# Patient Record
Sex: Female | Born: 1940 | Race: White | Hispanic: No | Marital: Married | Smoking: Never smoker
Health system: Southern US, Community
[De-identification: ages and names within clinical notes are randomized; demographics above are authoritative.]

## PROBLEM LIST (undated history)

## (undated) DIAGNOSIS — L409 Psoriasis, unspecified: Secondary | ICD-10-CM

## (undated) DIAGNOSIS — Z8719 Personal history of other diseases of the digestive system: Secondary | ICD-10-CM

## (undated) DIAGNOSIS — Z8619 Personal history of other infectious and parasitic diseases: Secondary | ICD-10-CM

## (undated) DIAGNOSIS — C50919 Malignant neoplasm of unspecified site of unspecified female breast: Secondary | ICD-10-CM

## (undated) DIAGNOSIS — M810 Age-related osteoporosis without current pathological fracture: Secondary | ICD-10-CM

## (undated) DIAGNOSIS — M1990 Unspecified osteoarthritis, unspecified site: Secondary | ICD-10-CM

## (undated) DIAGNOSIS — Z923 Personal history of irradiation: Secondary | ICD-10-CM

## (undated) HISTORY — PX: TONSILLECTOMY: SUR1361

## (undated) HISTORY — PX: COLONOSCOPY: SHX174

## (undated) HISTORY — DX: Psoriasis, unspecified: L40.9

## (undated) HISTORY — PX: FRACTURE SURGERY: SHX138

## (undated) HISTORY — DX: Age-related osteoporosis without current pathological fracture: M81.0

## (undated) HISTORY — PX: MASTECTOMY: SHX3

## (undated) HISTORY — DX: Unspecified osteoarthritis, unspecified site: M19.90

## (undated) HISTORY — DX: Malignant neoplasm of unspecified site of unspecified female breast: C50.919

---

## 2004-08-05 ENCOUNTER — Ambulatory Visit: Payer: Self-pay | Admitting: Internal Medicine

## 2006-03-03 ENCOUNTER — Ambulatory Visit: Payer: Self-pay | Admitting: Internal Medicine

## 2007-01-07 ENCOUNTER — Ambulatory Visit: Payer: Self-pay | Admitting: Gastroenterology

## 2007-03-08 ENCOUNTER — Ambulatory Visit: Payer: Self-pay | Admitting: Internal Medicine

## 2008-03-08 ENCOUNTER — Ambulatory Visit: Payer: Self-pay | Admitting: Internal Medicine

## 2009-03-09 ENCOUNTER — Ambulatory Visit: Payer: Self-pay | Admitting: Internal Medicine

## 2010-05-28 ENCOUNTER — Ambulatory Visit: Payer: Self-pay | Admitting: Internal Medicine

## 2011-06-05 ENCOUNTER — Emergency Department: Payer: Self-pay | Admitting: Emergency Medicine

## 2011-06-10 ENCOUNTER — Ambulatory Visit: Payer: Self-pay | Admitting: Orthopedic Surgery

## 2011-07-29 ENCOUNTER — Ambulatory Visit: Payer: Self-pay | Admitting: Internal Medicine

## 2012-05-31 ENCOUNTER — Ambulatory Visit: Payer: Self-pay | Admitting: Gastroenterology

## 2012-08-13 ENCOUNTER — Ambulatory Visit: Payer: Self-pay

## 2013-09-23 ENCOUNTER — Ambulatory Visit: Payer: Self-pay

## 2014-08-18 DIAGNOSIS — Z923 Personal history of irradiation: Secondary | ICD-10-CM

## 2014-08-18 HISTORY — PX: BREAST LUMPECTOMY: SHX2

## 2014-08-18 HISTORY — DX: Personal history of irradiation: Z92.3

## 2014-09-18 DIAGNOSIS — C50919 Malignant neoplasm of unspecified site of unspecified female breast: Secondary | ICD-10-CM

## 2014-09-18 HISTORY — DX: Malignant neoplasm of unspecified site of unspecified female breast: C50.919

## 2014-09-25 ENCOUNTER — Ambulatory Visit: Payer: Self-pay | Admitting: Internal Medicine

## 2014-09-26 ENCOUNTER — Ambulatory Visit: Payer: Self-pay | Admitting: Internal Medicine

## 2014-09-28 ENCOUNTER — Ambulatory Visit: Payer: Self-pay | Admitting: Internal Medicine

## 2014-09-28 HISTORY — PX: BREAST BIOPSY: SHX20

## 2014-10-10 ENCOUNTER — Ambulatory Visit: Payer: Self-pay | Admitting: Internal Medicine

## 2014-10-17 ENCOUNTER — Ambulatory Visit
Admit: 2014-10-17 | Disposition: A | Discharge status: Home / Self Care | Payer: Self-pay | Attending: Internal Medicine | Admitting: Internal Medicine

## 2014-11-09 ENCOUNTER — Ambulatory Visit: Payer: Self-pay | Admitting: Surgery

## 2014-11-09 LAB — CBC WITH DIFFERENTIAL/PLATELET
Basophil #: 0 10*3/uL (ref 0.0–0.1)
Basophil %: 0.6 %
Eosinophil #: 0.2 10*3/uL (ref 0.0–0.7)
Eosinophil %: 4.9 %
HCT: 39.5 % (ref 35.0–47.0)
HGB: 12.9 g/dL (ref 12.0–16.0)
LYMPHS ABS: 1.2 10*3/uL (ref 1.0–3.6)
Lymphocyte %: 23.9 %
MCH: 30.6 pg (ref 26.0–34.0)
MCHC: 32.7 g/dL (ref 32.0–36.0)
MCV: 94 fL (ref 80–100)
MONO ABS: 0.6 x10 3/mm (ref 0.2–0.9)
Monocyte %: 12.7 %
Neutrophil #: 2.8 10*3/uL (ref 1.4–6.5)
Neutrophil %: 57.9 %
PLATELETS: 139 10*3/uL — AB (ref 150–440)
RBC: 4.21 10*6/uL (ref 3.80–5.20)
RDW: 15.6 % — AB (ref 11.5–14.5)
WBC: 4.9 10*3/uL (ref 3.6–11.0)

## 2014-11-09 LAB — BASIC METABOLIC PANEL
ANION GAP: 7 (ref 7–16)
BUN: 15 mg/dL
Calcium, Total: 9.9 mg/dL
Chloride: 105 mmol/L
Co2: 27 mmol/L
Creatinine: 0.85 mg/dL
EGFR (African American): >60
GLUCOSE: 110 mg/dL — AB
Potassium: 4.7 mmol/L
Sodium: 139 mmol/L

## 2014-11-17 ENCOUNTER — Ambulatory Visit: Admit: 2014-11-17 | Disposition: A | Discharge status: Home / Self Care | Payer: Self-pay | Admitting: Surgery

## 2014-11-22 ENCOUNTER — Ambulatory Visit
Admit: 2014-11-22 | Disposition: A | Discharge status: Home / Self Care | Payer: Self-pay | Attending: Internal Medicine | Admitting: Internal Medicine

## 2014-12-07 ENCOUNTER — Ambulatory Visit: Payer: Commercial Managed Care - HMO

## 2014-12-08 ENCOUNTER — Ambulatory Visit: Payer: Commercial Managed Care - HMO

## 2014-12-11 ENCOUNTER — Ambulatory Visit: Payer: Commercial Managed Care - HMO

## 2014-12-11 LAB — SURGICAL PATHOLOGY

## 2014-12-12 ENCOUNTER — Ambulatory Visit: Payer: Commercial Managed Care - HMO

## 2014-12-13 ENCOUNTER — Ambulatory Visit: Payer: Commercial Managed Care - HMO

## 2014-12-14 ENCOUNTER — Other Ambulatory Visit: Payer: Self-pay | Admitting: *Deleted

## 2014-12-14 ENCOUNTER — Ambulatory Visit: Payer: Commercial Managed Care - HMO

## 2014-12-14 DIAGNOSIS — C50512 Malignant neoplasm of lower-outer quadrant of left female breast: Secondary | ICD-10-CM

## 2014-12-15 ENCOUNTER — Ambulatory Visit: Payer: Commercial Managed Care - HMO

## 2014-12-15 LAB — CBC CANCER CENTER
Basophil #: 0 x10 3/mm (ref 0.0–0.1)
Basophil %: 0.7 %
EOS PCT: 5.7 %
Eosinophil #: 0.2 x10 3/mm (ref 0.0–0.7)
HCT: 37.9 % (ref 35.0–47.0)
HGB: 12.6 g/dL (ref 12.0–16.0)
Lymphocyte #: 1 x10 3/mm (ref 1.0–3.6)
Lymphocyte %: 25.6 %
MCH: 30.8 pg (ref 26.0–34.0)
MCHC: 33.2 g/dL (ref 32.0–36.0)
MCV: 93 fL (ref 80–100)
MONO ABS: 0.6 x10 3/mm (ref 0.2–0.9)
Monocyte %: 14.6 %
NEUTROS ABS: 2.1 x10 3/mm (ref 1.4–6.5)
Neutrophil %: 53.4 %
PLATELETS: 131 x10 3/mm — AB (ref 150–440)
RBC: 4.09 10*6/uL (ref 3.80–5.20)
RDW: 15.3 % — ABNORMAL HIGH (ref 11.5–14.5)
WBC: 3.8 x10 3/mm (ref 3.6–11.0)

## 2014-12-17 NOTE — Consult Note (Signed)
Reason for Visit: This 74 year old Female patient presents to the clinic for initial evaluation of  breast cancer .   Referred by Dr. Tamala Julian.  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  Pathology Report pathology report given verbally have requested written copy   Imaging Report mammograms ultrasound reviewed   Referral Report clinical notes reviewed   Planned Treatment Regimen adjuvant whole breast radiation hypofractionated course plus aromatase inhibitor   HPI   patient is a pleasant 74 year old female who presented with an abnormal mammogram of her left breast showing a mass in the retroareolar region confirmed on ultrasound to be 0.7 x 0.4 cm 1 cm from nipple suspicious for malignancy. Ultrasound-guided core biopsy was positive for invasive lobular carcinoma. Tumor was ER/PR positive HER-2/neu not overexpressed.she went on to have a wide local excision and sentinel node biopsy. 6 nodes were negative. Tumor was 0.6 cm in greatest dimension margins clear. I requested formal pathology report for my review. Patient has comorbidities including psoriasis, psoriatic arthritis, diverticulitis, osteoarthritis and osteoporosis for which she is being treated with Fosamax. She is seen today by medical oncology as well as radiation oncology. She is healing well and from a breast standpoint is without complaint.  Past Hx:    Psoratic Arthritis:    ARTHRITIS:   Past, Family and Social History:  Past Medical History positive   Gastrointestinal diverticulitis; colon polyp   Past Surgical History tonsillectomy, adenoidectomy, left wrist fracture repair and right toe procedure   Past Medical History Comments psoriatic arthritis, psoriasis, osteoporosis   Family History positive   Family History Comments  family history positive for adult-onset diabetes, CVA hypertension and hyperlipidemia, father with lung cancer brother had a brain tumor   Social History noncontributory   Additional Past Medical and Surgical History seen by herself today   Allergies:   No Known Allergies:   Home Meds:  Home Medications: Medication Instructions Status  methotrexate 2.5 mg oral tablet 9 tab(s) orally once a week (on Friday) Active  alendronate 70 mg oral tablet 1 tab(s) orally once a week (on Friday) Active  folic acid 0.4 mg oral tablet 1 tab(s) orally once a day Active  Norco 325 mg-5 mg oral tablet 1-2 tab(s) orally every 4 hours, As Needed - for Pain Active   Review of Systems:  General negative   Performance Status (ECOG) 0   Skin negative   Breast see HPI   Ophthalmologic negative   ENMT negative   Respiratory and Thorax negative   Cardiovascular negative   Gastrointestinal negative   Genitourinary negative   Musculoskeletal negative   Neurological negative   Psychiatric negative   Hematology/Lymphatics negative   Endocrine negative   Allergic/Immunologic negative   Review of Systems   denies any weight loss, fatigue, weakness, fever, chills or night sweats. Patient denies any loss of vision, blurred vision. Patient denies any ringing  of the ears or hearing loss. No irregular heartbeat. Patient denies heart murmur or history of fainting. Patient denies any chest pain or pain radiating to her upper extremities. Patient denies any shortness of breath, difficulty breathing at night, cough or hemoptysis. Patient denies any swelling in the lower legs. Patient denies any nausea vomiting, vomiting of blood, or coffee ground material in the vomitus. Patient denies any stomach pain. Patient states has had normal  bowel movements no significant constipation or diarrhea. Patient denies any dysuria, hematuria or significant nocturia. Patient denies any problems walking, swelling in the  joints or loss of balance. Patient denies any skin changes, loss of hair or loss of weight. Patient denies any excessive worrying or anxiety or significant depression. Patient denies any problems with insomnia. Patient denies excessive thirst, polyuria, polydipsia. Patient denies any swollen glands, patient denies easy bruising or easy bleeding. Patient denies any recent infections, allergies or URI. Patient "s visual fields have not changed significantly in recent time.   Nursing Notes:  Nursing Vital Signs and Chemo Nursing Nursing Notes: *CC Vital Signs Flowsheet:   06-Apr-16 09:34  Temp Temperature 95.2  Pulse Pulse 91  Respirations Respirations 18  SBP SBP 153  DBP DBP 78  Pain Scale (0-10)  0  Current Weight (kg) (kg) 61.2  Height (cm) centimeters 154.9  BSA (m2) 1.5   Physical Exam:  General/Skin/HEENT:  General normal   Skin normal   Eyes normal   ENMT normal   Head and Neck normal   Additional PE well-developed well-nourished female in NAD. She is status post wide local excision circumventing the left nipple areolar complex. Incision is well-healed no dominant mass or nodularity is noted in either breast in 2 positions examined. No axillary or supraclavicular adenopathy is appreciated bilaterally. Lungs are clear to A&P cardiac examination shows regular rate and rhythm.   Breasts/Resp/CV/GI/GU:  Respiratory and Thorax normal   Cardiovascular normal   Gastrointestinal normal   Genitourinary normal   MS/Neuro/Psych/Lymph:  Musculoskeletal normal   Neurological normal   Lymphatics normal   Other Results:  Radiology Results: [place]:    09-Feb-16 14:41, Digital Additional Views Lt Breast (SCR)  Digital Additional Views Lt Breast (SCR)   REASON FOR EXAM:    AV LT MASS  COMMENTS:       PROCEDURE: MAM - MAM DGTL ADD VW LT  SCR  - Sep 26 2014  2:41PM     CLINICAL DATA:  The patient returns after screening study for  evaluation of possible left  breast mass.    EXAM:  DIGITAL DIAGNOSTIC LEFT MAMMOGRAM    ULTRASOUND LEFT BREAST    COMPARISON:  09/25/2014 and earlier  ACR Breast Density Category c: The breast tissue is heterogeneously  dense, which may obscure small masses.    FINDINGS:  Additional views are performed, confirming presence of possible mass  in the immediate retroareolar region of the left breast.    On physical exam, I palpate no abnormality in the retroareolar  region of the left breast. Normal position and appearance of the  left nipple.    Targeted ultrasound is performed, showing irregular hypoechoic  nodule in the 6 o'clock location of the left breast 1 cm from the  nipple measuring 0.7 x 0.4 x 0.4 cm. No associated internal  vascularity. Evaluation of the left axilla is negative for  adenopathy.     IMPRESSION:  Suspicious mass in the 6 o'clock retroareolar region of the left  breast. Tissue diagnosis is recommended.    RECOMMENDATION:  Ultrasound-guided core biopsy is recommended.    I have discussed the findings and recommendations with the patient.  Results were also provided in writing at the conclusion of the  visit. If applicable, a reminder letter will be sent to the patient  regarding the next appointment.    BI-RADS CATEGORY  4: Suspicious.  Electronically Signed    By: Nolon Nations M.D.  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 Would also boost or scar another 1400 cGy  using electron beam. Patient is on methotrexate which make up her incidence of pulmonary complications we will do our best to exclude as much lung as possible from her treatment fields. Risks and benefits of treatment including skin reaction, fatigue, inclusion of some superficial lung, alteration of blood counts, all were described in detail to the patient. She seems to comprehend my treatment plan well. I have set up and ordered CT simulation on her early next week.  I would like to take this opportunity for allowing me to participate in the care of your patient..  Fax to Physician:  Physicians To Recieve Fax: Glendon Axe, MD - [phone] Rochel Brome, MD - [phone].  Electronic Signatures: Cachet Mccutchen, Roda Shutters (MD)  (Signed 06-Apr-16 10:58)  Authored: HPI, Diagnosis, Past Hx, PFSH, Allergies, Home Meds, ROS, Nursing Notes, Physical Exam, Other Results, Relevent Results, Encounter Assessment and Plan, Fax to Physician   Last Updated: 06-Apr-16 10:58 by Armstead Peaks (MD)

## 2014-12-17 NOTE — Op Note (Signed)
PATIENT NAME:  April Mills, April Mills MR#:  [id number] DATE OF BIRTH:  1941-04-05  DATE OF PROCEDURE:  11/17/2014  PREOPERATIVE DIAGNOSIS: Carcinoma of the left breast.   POSTOPERATIVE DIAGNOSIS: Carcinoma of the left breast.   PROCEDURE: Left partial mastectomy with axillary sentinel lymph node biopsy.   SURGEON: Rochel Brome, MD  ANESTHESIA: General.   INDICATIONS: This 74 year old female has a recent finding of a 7 mm mass at the 6 o'clock position of the left breast. 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 She had preoperative x-ray needle localization and injection of radioactive technetium sulfur colloid.   DESCRIPTION OF PROCEDURE: The patient was placed on the operating table in the supine position under general anesthesia. The left arm was placed on a lateral arm support. The dressing was removed from the inferior aspect of the left breast exposing the Kopans wire which entered the 6 o'clock position of the breast. The wire was cut 2 cm from the skin. The breast, axilla, and surrounding chest wall were prepared with ChloraPrep and draped in a sterile manner.   Her mammogram images were reviewed seeing the location of the biopsy marker, in the 6 o'clock position.  A transversely oriented curvilinear incision was made about 1 cm below the nipple and extending from approximately the 5 o'clock position to the 7 o'clock position. Incision was carried down through subcutaneous tissues and encountered the wire and removed a portion of tissue surrounding the wire which was approximately 3 x 3 x 3 cm in dimension, and it was labeled with the margin map to mark the skin side, the deep margin, also the medial, lateral, cranial and caudal margins. The specimen was submitted for specimen mammogram and pathology.   The axilla was probed with a gamma counter demonstrating location of radioactivity in the inferior aspect of the axilla. An oblique 4 cm incision was made in  the inferior aspect of the axilla and carried down through subcutaneous tissues. Several small bleeding points were cauterized. The gamma counter was used to demonstrate location of radioactivity and dissected a lymph node with some surrounding fatty tissue free from surrounding structures. The ex vivo count was in the range of 200 to 250 counts per second and this was sent as sentinel lymph node #1. There was additional radioactivity found and dissected out another portion of tissue somewhat deeper in which the ex vivo count was negligible and this was labeled as just axillary content which was just fatty tissue. Next, there was a lymph node found deep to this adjacent to the rib cage which was resected with some small amount of surrounding fatty tissue. This lymph node was approximately 6 mm and the counts per second in the range of 16 to 24 and submitted as sentinel lymph node #2. The background count was negligible. There was no remaining palpable mass within the axilla and hemostasis appeared to be intact.   It is noted that during the course of the procedure the specimen was submitted for specimen mammogram which I viewed demonstrating location of the biopsy marker centrally located. The pathologist later called back to report that margins appeared to be clear.   Both wounds were inspected and could see hemostasis was intact. Subcutaneous tissues were infiltrated with 0.5% Sensorcaine with epinephrine.   The breast wound was closed with interrupted 4-0 chromic subcutaneous sutures and a running 4-0 Monocryl subcuticular suture.   The axillary wound was closed in a similar manner and then both wounds were treated  with LiquiBand and allowed to dry and subsequently prepared for transfer to the recovery room.   ____________________________ Lenna Sciara. Rochel Brome, MD jws:sb D: 11/17/2014 15:03:34 ET T: 11/17/2014 15:13:23 ET JOB#: [id number]  cc: Loreli Dollar, MD, <Dictator> Loreli Dollar  MD ELECTRONICALLY SIGNED 11/22/2014 16:26

## 2014-12-18 ENCOUNTER — Ambulatory Visit: Payer: Self-pay | Admitting: Radiation Oncology

## 2014-12-18 ENCOUNTER — Ambulatory Visit: Payer: Commercial Managed Care - HMO | Admitting: Radiation Oncology

## 2014-12-18 ENCOUNTER — Ambulatory Visit
Admission: RE | Admit: 2014-12-18 | Discharge: 2014-12-18 | Disposition: A | Discharge status: Home / Self Care | Payer: Commercial Managed Care - HMO | Source: Ambulatory Visit | Attending: Radiation Oncology | Admitting: Radiation Oncology

## 2014-12-18 DIAGNOSIS — C50912 Malignant neoplasm of unspecified site of left female breast: Secondary | ICD-10-CM | POA: Insufficient documentation

## 2014-12-18 DIAGNOSIS — Z510 Encounter for antineoplastic radiation therapy: Secondary | ICD-10-CM | POA: Insufficient documentation

## 2014-12-18 DIAGNOSIS — Z170 Estrogen receptor positive status [ER+]: Secondary | ICD-10-CM | POA: Diagnosis not present

## 2014-12-19 ENCOUNTER — Ambulatory Visit
Admission: RE | Admit: 2014-12-19 | Discharge: 2014-12-19 | Disposition: A | Discharge status: Home / Self Care | Payer: Commercial Managed Care - HMO | Source: Ambulatory Visit | Attending: Radiation Oncology | Admitting: Radiation Oncology

## 2014-12-19 DIAGNOSIS — Z510 Encounter for antineoplastic radiation therapy: Secondary | ICD-10-CM | POA: Diagnosis not present

## 2014-12-20 ENCOUNTER — Ambulatory Visit
Admission: RE | Admit: 2014-12-20 | Discharge: 2014-12-20 | Disposition: A | Discharge status: Home / Self Care | Payer: Commercial Managed Care - HMO | Source: Ambulatory Visit | Attending: Radiation Oncology | Admitting: Radiation Oncology

## 2014-12-20 DIAGNOSIS — Z510 Encounter for antineoplastic radiation therapy: Secondary | ICD-10-CM | POA: Diagnosis not present

## 2014-12-21 ENCOUNTER — Ambulatory Visit
Admission: RE | Admit: 2014-12-21 | Discharge: 2014-12-21 | Disposition: A | Discharge status: Home / Self Care | Payer: Commercial Managed Care - HMO | Source: Ambulatory Visit | Attending: Radiation Oncology | Admitting: Radiation Oncology

## 2014-12-21 DIAGNOSIS — Z510 Encounter for antineoplastic radiation therapy: Secondary | ICD-10-CM | POA: Diagnosis not present

## 2014-12-22 ENCOUNTER — Ambulatory Visit
Admission: RE | Admit: 2014-12-22 | Discharge: 2014-12-22 | Disposition: A | Discharge status: Home / Self Care | Payer: Commercial Managed Care - HMO | Source: Ambulatory Visit | Attending: Radiation Oncology | Admitting: Radiation Oncology

## 2014-12-22 DIAGNOSIS — Z510 Encounter for antineoplastic radiation therapy: Secondary | ICD-10-CM | POA: Diagnosis not present

## 2014-12-25 ENCOUNTER — Ambulatory Visit
Admission: RE | Admit: 2014-12-25 | Discharge: 2014-12-25 | Disposition: A | Discharge status: Home / Self Care | Payer: Commercial Managed Care - HMO | Source: Ambulatory Visit | Attending: Radiation Oncology | Admitting: Radiation Oncology

## 2014-12-25 DIAGNOSIS — Z510 Encounter for antineoplastic radiation therapy: Secondary | ICD-10-CM | POA: Diagnosis not present

## 2014-12-26 ENCOUNTER — Ambulatory Visit
Admission: RE | Admit: 2014-12-26 | Discharge: 2014-12-26 | Disposition: A | Discharge status: Home / Self Care | Payer: Commercial Managed Care - HMO | Source: Ambulatory Visit | Attending: Radiation Oncology | Admitting: Radiation Oncology

## 2014-12-26 DIAGNOSIS — Z510 Encounter for antineoplastic radiation therapy: Secondary | ICD-10-CM | POA: Diagnosis not present

## 2014-12-27 ENCOUNTER — Ambulatory Visit
Admission: RE | Admit: 2014-12-27 | Discharge: 2014-12-27 | Disposition: A | Discharge status: Home / Self Care | Payer: Commercial Managed Care - HMO | Source: Ambulatory Visit | Attending: Radiation Oncology | Admitting: Radiation Oncology

## 2014-12-27 DIAGNOSIS — Z510 Encounter for antineoplastic radiation therapy: Secondary | ICD-10-CM | POA: Diagnosis not present

## 2014-12-28 ENCOUNTER — Ambulatory Visit
Admission: RE | Admit: 2014-12-28 | Discharge: 2014-12-28 | Disposition: A | Discharge status: Home / Self Care | Payer: Commercial Managed Care - HMO | Source: Ambulatory Visit | Attending: Radiation Oncology | Admitting: Radiation Oncology

## 2014-12-28 DIAGNOSIS — Z510 Encounter for antineoplastic radiation therapy: Secondary | ICD-10-CM | POA: Diagnosis not present

## 2014-12-29 ENCOUNTER — Encounter (INDEPENDENT_AMBULATORY_CARE_PROVIDER_SITE_OTHER): Payer: Self-pay

## 2014-12-29 ENCOUNTER — Inpatient Hospital Stay: Payer: Commercial Managed Care - HMO | Attending: Internal Medicine

## 2014-12-29 DIAGNOSIS — Z170 Estrogen receptor positive status [ER+]: Secondary | ICD-10-CM | POA: Diagnosis not present

## 2014-12-29 DIAGNOSIS — C50512 Malignant neoplasm of lower-outer quadrant of left female breast: Secondary | ICD-10-CM | POA: Insufficient documentation

## 2014-12-29 LAB — CBC
HCT: 38.3 % (ref 35.0–47.0)
HEMOGLOBIN: 12.7 g/dL (ref 12.0–16.0)
MCH: 30.7 pg (ref 26.0–34.0)
MCHC: 33.1 g/dL (ref 32.0–36.0)
MCV: 92.8 fL (ref 80.0–100.0)
Platelets: 109 10*3/uL — ABNORMAL LOW (ref 150–440)
RBC: 4.13 MIL/uL (ref 3.80–5.20)
RDW: 15.6 % — ABNORMAL HIGH (ref 11.5–14.5)
WBC: 3.6 10*3/uL (ref 3.6–11.0)

## 2015-01-01 DIAGNOSIS — Z510 Encounter for antineoplastic radiation therapy: Secondary | ICD-10-CM | POA: Diagnosis not present

## 2015-01-02 DIAGNOSIS — Z510 Encounter for antineoplastic radiation therapy: Secondary | ICD-10-CM | POA: Diagnosis not present

## 2015-01-03 DIAGNOSIS — Z510 Encounter for antineoplastic radiation therapy: Secondary | ICD-10-CM | POA: Diagnosis not present

## 2015-01-04 DIAGNOSIS — Z510 Encounter for antineoplastic radiation therapy: Secondary | ICD-10-CM | POA: Diagnosis not present

## 2015-01-05 ENCOUNTER — Inpatient Hospital Stay: Payer: Commercial Managed Care - HMO

## 2015-01-05 DIAGNOSIS — Z510 Encounter for antineoplastic radiation therapy: Secondary | ICD-10-CM | POA: Diagnosis not present

## 2015-01-08 DIAGNOSIS — Z510 Encounter for antineoplastic radiation therapy: Secondary | ICD-10-CM | POA: Diagnosis not present

## 2015-01-09 ENCOUNTER — Ambulatory Visit
Admission: RE | Admit: 2015-01-09 | Discharge: 2015-01-09 | Disposition: A | Discharge status: Home / Self Care | Payer: Commercial Managed Care - HMO | Source: Ambulatory Visit | Attending: Radiation Oncology | Admitting: Radiation Oncology

## 2015-01-09 DIAGNOSIS — Z510 Encounter for antineoplastic radiation therapy: Secondary | ICD-10-CM | POA: Diagnosis not present

## 2015-01-18 ENCOUNTER — Other Ambulatory Visit: Payer: Self-pay

## 2015-01-18 DIAGNOSIS — C50912 Malignant neoplasm of unspecified site of left female breast: Secondary | ICD-10-CM

## 2015-01-19 ENCOUNTER — Inpatient Hospital Stay (HOSPITAL_BASED_OUTPATIENT_CLINIC_OR_DEPARTMENT_OTHER): Payer: Commercial Managed Care - HMO | Admitting: Internal Medicine

## 2015-01-19 ENCOUNTER — Inpatient Hospital Stay: Payer: Commercial Managed Care - HMO | Attending: Internal Medicine

## 2015-01-19 VITALS — BP 148/76 | HR 77 | Temp 95.9°F | Resp 18 | Ht 62.0 in | Wt 136.7 lb

## 2015-01-19 DIAGNOSIS — Z86010 Personal history of colonic polyps: Secondary | ICD-10-CM

## 2015-01-19 DIAGNOSIS — Z79811 Long term (current) use of aromatase inhibitors: Secondary | ICD-10-CM

## 2015-01-19 DIAGNOSIS — M1990 Unspecified osteoarthritis, unspecified site: Secondary | ICD-10-CM | POA: Insufficient documentation

## 2015-01-19 DIAGNOSIS — Z79899 Other long term (current) drug therapy: Secondary | ICD-10-CM | POA: Insufficient documentation

## 2015-01-19 DIAGNOSIS — Z170 Estrogen receptor positive status [ER+]: Secondary | ICD-10-CM

## 2015-01-19 DIAGNOSIS — C50912 Malignant neoplasm of unspecified site of left female breast: Secondary | ICD-10-CM

## 2015-01-19 DIAGNOSIS — M129 Arthropathy, unspecified: Secondary | ICD-10-CM | POA: Insufficient documentation

## 2015-01-19 DIAGNOSIS — M818 Other osteoporosis without current pathological fracture: Secondary | ICD-10-CM | POA: Insufficient documentation

## 2015-01-19 DIAGNOSIS — L409 Psoriasis, unspecified: Secondary | ICD-10-CM | POA: Insufficient documentation

## 2015-01-19 DIAGNOSIS — Z923 Personal history of irradiation: Secondary | ICD-10-CM | POA: Diagnosis not present

## 2015-01-19 DIAGNOSIS — C50512 Malignant neoplasm of lower-outer quadrant of left female breast: Secondary | ICD-10-CM | POA: Insufficient documentation

## 2015-01-19 LAB — HEPATIC FUNCTION PANEL
ALBUMIN: 3.5 g/dL (ref 3.5–5.0)
ALT: 22 U/L (ref 14–54)
AST: 34 U/L (ref 15–41)
Alkaline Phosphatase: 72 U/L (ref 38–126)
BILIRUBIN TOTAL: 0.5 mg/dL (ref 0.3–1.2)
Bilirubin, Direct: <0.1 mg/dL — ABNORMAL LOW (ref 0.1–0.5)
Total Protein: 6.8 g/dL (ref 6.5–8.1)

## 2015-01-19 LAB — CBC WITH DIFFERENTIAL/PLATELET
Basophils Absolute: 0 10*3/uL (ref 0–0.1)
Basophils Relative: 1 %
Eosinophils Absolute: 0.2 10*3/uL (ref 0–0.7)
Eosinophils Relative: 5 %
HCT: 36 % (ref 35.0–47.0)
Hemoglobin: 11.8 g/dL — ABNORMAL LOW (ref 12.0–16.0)
Lymphocytes Relative: 17 %
Lymphs Abs: 0.7 10*3/uL — ABNORMAL LOW (ref 1.0–3.6)
MCH: 30.9 pg (ref 26.0–34.0)
MCHC: 32.8 g/dL (ref 32.0–36.0)
MCV: 94.2 fL (ref 80.0–100.0)
Monocytes Absolute: 0.5 10*3/uL (ref 0.2–0.9)
Monocytes Relative: 14 %
Neutro Abs: 2.5 10*3/uL (ref 1.4–6.5)
PLATELETS: 99 10*3/uL — AB (ref 150–440)
RBC: 3.82 MIL/uL (ref 3.80–5.20)
RDW: 16.1 % — ABNORMAL HIGH (ref 11.5–14.5)
WBC: 3.9 10*3/uL (ref 3.6–11.0)

## 2015-01-19 LAB — CREATININE, SERUM
CREATININE: 0.74 mg/dL (ref 0.44–1.00)
GFR calc Af Amer: >60 mL/min (ref >60)
GFR calc non Af Amer: >60 mL/min (ref >60)

## 2015-01-19 MED ORDER — ANASTROZOLE 1 MG PO TABS: 1.0000 mg | ORAL_TABLET | Freq: Every day | ORAL | Status: DC

## 2015-01-20 LAB — CANCER ANTIGEN 27.29: CA 27.29: 28.1 U/mL (ref 0.0–38.6)

## 2015-02-06 NOTE — Progress Notes (Signed)
[facility]  [phone] [phone]     ID: QUINCY PRISCO OB: [birth year]  MR#: [id number]  YBW#:[id number]  Patient Care Team: Glendon Axe, MD as PCP - General (Internal Medicine)  CHIEF COMPLAINT/DIAGNOSIS:  Stage I (pT1b pN0 sn cM0) invasive lobular carcinoma with LCIS left breast lower outer quadrant (5:30-6 o'clock position) diagnosed by ultrasound-guided core biopsy on 09/28/14, invasive carcinoma on biopsy measures 0.5 cm, grade 1.  ER positive (>90%,), PR positive (>90%), HER2neu negative (2+ on IHC but FISH negative and Her2/CEP17 ratio 1.15). 11/17/14 - left breast lumpectomy and SLN study. Then had breast radiation.    HISTORY OF PRESENT ILLNESS:  Patient returns for oncology f/u and plan hormonal therapy. She had surgery on 4/1 and then completing breast radiation. States she is doing steady. Has h/o invasive lobular carcinoma with LCIS grade 1. ER and PR positive greater than 90%, HER2/neu negative on FISH with ratio of 1.15. Appetite is good, no unintentional weight loss. She has chronic arthritis which is unchanged, no new bone pains. No new cough, chest pain, dyspnea, or hemoptysis. She is physically active and ambulatory. Denies pain issues, 0/10.   REVIEW OF SYSTEMS:   ROS As in HPI above. In addition, no fever, chills or sweats. No new headaches or focal weakness.  No new mood disturbances. No  sore throat, cough, shortness of breath, sputum, hemoptysis or chest pain. No dizziness or palpitation. No abdominal pain, constipation, diarrhea, dysuria or hematuria. No new skin rash or bleeding symptoms. No new paresthesias in extremities. PS ECOG 0.  PAST MEDICAL HISTORY: Reviewed Past Medical History  Diagnosis Date  . Arthritis   . Breast cancer           Psoriasis  Psoriatic arthritis  Diverticulitis  Osteoarthritis  History of colon polyps  Chickenpox  Osteoporosis status post Fosamax treatment, Bone density September 2013 revealed  Osteopenia   PAST SURGICAL HISTORY:Reviewed         Left wrist fracture repair  Tonsillectomy and adenoidectomy  Right toe procedure for bone removal             11/17/14 - left breast lumpectomy and SLN study for stage I breast Ca.  FAMILY HISTORY:Reviewed Remarkable for diabetes, stroke, hypertension, hyperlipidemia. Father had lung cancer, brother had brain tumor. Denies history of colon breast or ovarian malignancies.  ADVANCED DIRECTIVES:  No - patient declined information  SOCIAL HISTORY: Reviewed Denies smoking, alcohol or decreased usage. Physically active.  No Known Allergies  Current Outpatient Prescriptions  Medication Sig Dispense Refill  . alendronate (FOSAMAX) 70 MG tablet Take 70 mg by mouth once a week. Take with a full glass of water on an empty stomach.    . folic acid (FOLVITE) 768 MCG tablet Take 400 mcg by mouth daily.    Marland Kitchen HYDROcodone-acetaminophen (NORCO/VICODIN) 5-325 MG per tablet Take 2 tablets by mouth every 4 (four) hours as needed for moderate pain.    . methotrexate (RHEUMATREX) 2.5 MG tablet Take 22.5 mg by mouth once a week. Caution:Chemotherapy. Protect from light.    Marland Kitchen anastrozole (ARIMIDEX) 1 MG tablet Take 1 tablet (1 mg total) by mouth daily. 30 tablet 11   No current facility-administered medications for this visit.    OBJECTIVE: Filed Vitals:   01/19/15 1215  BP:   Pulse:   Temp:   Resp: 18     Body mass index is 24.99 kg/(m^2).    ECOG FS:0 - Asymptomatic  GENERAL: Patient is alert and  oriented and in no acute distress. There is no icterus. HEENT: EOMs intact. No cervical lymphadenopathy. CVS: S1S2, regular LUNGS: Bilaterally clear to auscultation, no rhonchi. ABDOMEN: Soft, nontender. No hepatomegaly clinically.  EXTREMITIES: No pedal edema.  LAB RESULTS:    Component Value Date/Time   NA 139 11/09/2014 1257   K 4.7 11/09/2014 1257   CL 105 11/09/2014 1257   CO2 27 11/09/2014 1257   GLUCOSE 110* 11/09/2014 1257   BUN 15  11/09/2014 1257   CREATININE 0.74 01/19/2015 1047   CREATININE 0.85 11/09/2014 1257   CALCIUM 9.9 11/09/2014 1257   PROT 6.8 01/19/2015 1047   ALBUMIN 3.5 01/19/2015 1047   AST 34 01/19/2015 1047   ALT 22 01/19/2015 1047   ALKPHOS 72 01/19/2015 1047   BILITOT 0.5 01/19/2015 1047   GFRNONAA >60 01/19/2015 1047   GFRNONAA >60 11/09/2014 1257   GFRAA >60 01/19/2015 1047   GFRAA >60 11/09/2014 1257   Lab Results  Component Value Date   WBC 3.9 01/19/2015   NEUTROABS 2.5 01/19/2015   HGB 11.8* 01/19/2015   HCT 36.0 01/19/2015   MCV 94.2 01/19/2015   PLT 99* 01/19/2015     STUDIES: 11/17/14  - Left breast lumpectomy and SLN study Surgical Pathology report:   DIAGNOSIS: A.  BREAST, LEFT; LUMPECTOMY WITH NEEDLE LOCALIZATION:  RESIDUAL INVASIVE LOBULAR CARCINOMA OF BREAST, NOTTINGHAM GRADE 2, 0.6 CM IN GREATEST DIMENSION.  FOCAL LOBULAR CARCINOMA IN SITU.  FIBROCYSTIC CHANGES WITH DUCTAL EPITHELIAL HYPERPLASIA.  BIOPSY CHANGES.  NO INVASIVE CARCINOMA SEEN IN MARGINS OF SPECIMEN. B.  LYMPH NODE, LEFT SENTINEL #1; SENTINEL LYMPH NODE BIOPSY:  FOUR LYMPH NODES (0/4), NEGATIVE FOR METASTATIC CARCINOMA. C.  LYMPH NODE, LEFT SENTINEL #2; SENTINEL LYMPH NODE BIOPSY: ONE LYMPH NODE (0/1), NEGATIVE FOR METASTATIC CARCINOMA. D.  LYMPH NODES, LEFT AXILLARY; AXILLARY NODE DISSECTION:  FIBROFATTY TISSUE, NO LYMPHOID TISSUE PRESENT. Comment:  IHC prognostic markers performed on the previous biopsy specimen ([id number]) reveal the following results:  ER POSITIVE (>90%), PR POSITIVE (>90%), AND HER2 EQUIVOCAL (2+ IHC).  HER2 NEGATIVE (ISH). Immunoperoxidase stains performed on formalin fixed and paraffin embedded tissue sections of the lymph nodes (blocks B1 and C1) for cytokeratin AE1/AE3 are non-reactive within malignant cells supporting the histologic interpretation of negative for metastatic carcinoma. The control stains appropriately. Ductal Carcinoma In Situ (DCIS):   No DCIS is present Tumor Size /  Focality:   Tumor Size: Size of Largest Invasive Carcinoma: Greatest Dimension (mm): 0.6cm.. Margins uninvolved by invasive carcinoma.  Total Number of Lymph Nodes Examined: 5. Micro / Macro Metastases: Not identified. STAGE (pTNM) -  Primary Tumor (Invasive Carcinoma) (pT): pT1b:  Tumor > 5 mm but <= 10 mm in greatest dimension.  Regional Lymph Nodes (pN)  pN0: No regional lymph node metastasis identified histologically.   STAGING: Breast cancer   Staging form: Breast, AJCC 7th Edition     Clinical stage from 02/06/2015: Stage IA (T1, N0, M0) - Signed by Leia Alf, MD on 02/06/2015   ASSESSMENT / PLAN:   1. 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  ER positive (>90%,), PR positive (>90%), HER2neu negative (2+ on IHC but FISH negative and Her2/CEP17 ratio 1.15).  On 11/17/14 - left breast lumpectomy and SLN study. Then had breast radiation  -  Have reviewed surgical path report and d/w patient. She  is completing breast radiation. Have again discussed rationale and benefits of  pursuing hormonal therapy with aromatase inhibitor for 5 years since tumor is ER positive, and also discussed possible side effect, she is agreeable to take this and expressed verbal consent. Will start Anastrozole 1 mg PO daily. Will f/u in 4 months with labs. 2. Osteoporosis - takes Ca+D and Fosamax. Get DEXA scan for re-evaluation.  3. In between visits, she was advised to call in case of new side effects from Anastrozole or other new symptoms. She is agreeable to this plan.    Leia Alf, MD   02/06/2015 8:46 PM

## 2015-02-16 ENCOUNTER — Ambulatory Visit
Admission: RE | Admit: 2015-02-16 | Discharge: 2015-02-16 | Disposition: A | Discharge status: Home / Self Care | Payer: Commercial Managed Care - HMO | Source: Ambulatory Visit | Attending: Radiation Oncology | Admitting: Radiation Oncology

## 2015-02-16 ENCOUNTER — Encounter: Payer: Self-pay | Admitting: Radiation Oncology

## 2015-02-16 VITALS — BP 136/71 | HR 78 | Temp 96.4°F | Resp 18 | Wt 138.1 lb

## 2015-02-16 DIAGNOSIS — C50912 Malignant neoplasm of unspecified site of left female breast: Secondary | ICD-10-CM

## 2015-02-16 NOTE — Progress Notes (Signed)
Survivorship visit- Survivorship visit completed. Survivorship Care plan given and reviewed with patients. [facility] answers to Survivorship Care booklet given and reviewed with patient. Resources given about CARE program and Cancer Transitions.Pt verbalized understanding.

## 2015-02-16 NOTE — Progress Notes (Signed)
Radiation Oncology Follow up Note  Name: April Mills   Date:   02/16/2015 MRN:  [id number] DOB: 1940/12/09    This 74 y.o. female presents to the clinic today for follow-up for breast cancer stage IA ER/PR positive HER-2/neu not overexpressed invasive lobular carcinoma.  REFERRING PROVIDER: Glendon Axe, MD  HPI: Patient is a 74 year old female now 1 month out having completed whole breast radiation to her left breast for stage IA (T1 BN 0 M0) ER/PR positive HER-2/neu negative invasive lobular carcinoma. She is seen today in routine follow-up and is doing well. She specifically denies breast tenderness cough or bone pain. Does have some slight exacerbation of her psoriasis. She has been started on aromatase inhibitor and is tolerating that well without side effect. She is also on reduced dose methotrexate for her psoriasis..  COMPLICATIONS OF TREATMENT: none  FOLLOW UP COMPLIANCE: keeps appointments   PHYSICAL EXAM:  BP 136/71 mmHg  Pulse 78  Temp(Src) 96.4 F (35.8 C)  Resp 18  Wt 138 lb 1.9 oz (62.65 kg) Lungs are clear to A&P cardiac examination essentially unremarkable with regular rate and rhythm. No dominant mass or nodularity is noted in either breast in 2 positions examined. Incision is well-healed. No axillary or supraclavicular adenopathy is appreciated. Cosmetic result is excellent. Still some slight hyperpigmentation and skin which would expect. Right breast shows some patchy psoriasis. Well-developed well-nourished patient in NAD. HEENT reveals PERLA, EOMI, discs not visualized.  Oral cavity is clear. No oral mucosal lesions are identified. Neck is clear without evidence of cervical or supraclavicular adenopathy. Lungs are clear to A&P. Cardiac examination is essentially unremarkable with regular rate and rhythm without murmur rub or thrill. Abdomen is benign with no organomegaly or masses noted. Motor sensory and DTR levels are equal and symmetric in the upper and lower  extremities. Cranial nerves II through XII are grossly intact. Proprioception is intact. No peripheral adenopathy or edema is identified. No motor or sensory levels are noted. Crude visual fields are within normal range.   RADIOLOGY RESULTS: No new radiology reports to review  PLAN: At the present time she is recovering well from her radiation therapy treatments. I am please were overall overall progress. She continues on aromatase inhibitor therapy without side effect. I have asked to see her back in 4-5 months for follow-up. She knows to call sooner with any concerns.  I would like to take this opportunity for allowing me to participate in the care of your patient.Armstead Peaks., MD

## 2015-04-12 NOTE — Progress Notes (Signed)
"  Thinking of you" card mailed today.   

## 2015-05-25 ENCOUNTER — Inpatient Hospital Stay (HOSPITAL_BASED_OUTPATIENT_CLINIC_OR_DEPARTMENT_OTHER): Payer: Commercial Managed Care - HMO | Admitting: Internal Medicine

## 2015-05-25 ENCOUNTER — Encounter: Payer: Self-pay | Admitting: Internal Medicine

## 2015-05-25 ENCOUNTER — Inpatient Hospital Stay: Payer: Commercial Managed Care - HMO | Attending: Internal Medicine

## 2015-05-25 VITALS — BP 145/86 | HR 80 | Temp 96.9°F | Resp 20 | Ht 62.0 in | Wt 141.3 lb

## 2015-05-25 DIAGNOSIS — Z79899 Other long term (current) drug therapy: Secondary | ICD-10-CM | POA: Diagnosis not present

## 2015-05-25 DIAGNOSIS — D696 Thrombocytopenia, unspecified: Secondary | ICD-10-CM | POA: Diagnosis not present

## 2015-05-25 DIAGNOSIS — Z86010 Personal history of colonic polyps: Secondary | ICD-10-CM | POA: Diagnosis not present

## 2015-05-25 DIAGNOSIS — C50919 Malignant neoplasm of unspecified site of unspecified female breast: Secondary | ICD-10-CM

## 2015-05-25 DIAGNOSIS — Z170 Estrogen receptor positive status [ER+]: Secondary | ICD-10-CM

## 2015-05-25 DIAGNOSIS — C50512 Malignant neoplasm of lower-outer quadrant of left female breast: Secondary | ICD-10-CM

## 2015-05-25 DIAGNOSIS — Z79811 Long term (current) use of aromatase inhibitors: Secondary | ICD-10-CM | POA: Diagnosis not present

## 2015-05-25 DIAGNOSIS — M810 Age-related osteoporosis without current pathological fracture: Secondary | ICD-10-CM

## 2015-05-25 DIAGNOSIS — C50912 Malignant neoplasm of unspecified site of left female breast: Secondary | ICD-10-CM

## 2015-05-25 LAB — CBC WITH DIFFERENTIAL/PLATELET
BASOS ABS: 0 10*3/uL (ref 0–0.1)
Basophils Relative: 1 %
Eosinophils Absolute: 0.2 10*3/uL (ref 0–0.7)
Eosinophils Relative: 5 %
HEMATOCRIT: 36.9 % (ref 35.0–47.0)
HEMOGLOBIN: 12.5 g/dL (ref 12.0–16.0)
LYMPHS PCT: 22 %
Lymphs Abs: 1 10*3/uL (ref 1.0–3.6)
MCH: 31.5 pg (ref 26.0–34.0)
MCHC: 33.9 g/dL (ref 32.0–36.0)
MCV: 92.8 fL (ref 80.0–100.0)
Monocytes Absolute: 0.6 10*3/uL (ref 0.2–0.9)
Monocytes Relative: 14 %
NEUTROS ABS: 2.7 10*3/uL (ref 1.4–6.5)
NEUTROS PCT: 58 %
Platelets: 118 10*3/uL — ABNORMAL LOW (ref 150–440)
RBC: 3.98 MIL/uL (ref 3.80–5.20)
RDW: 15.3 % — ABNORMAL HIGH (ref 11.5–14.5)
WBC: 4.6 10*3/uL (ref 3.6–11.0)

## 2015-05-25 LAB — HEPATIC FUNCTION PANEL
ALT: 20 U/L (ref 14–54)
AST: 36 U/L (ref 15–41)
Albumin: 3.6 g/dL (ref 3.5–5.0)
Alkaline Phosphatase: 75 U/L (ref 38–126)
Bilirubin, Direct: <0.1 mg/dL — ABNORMAL LOW (ref 0.1–0.5)
TOTAL PROTEIN: 7.2 g/dL (ref 6.5–8.1)
Total Bilirubin: 0.4 mg/dL (ref 0.3–1.2)

## 2015-05-25 LAB — CREATININE, SERUM
Creatinine, Ser: 0.77 mg/dL (ref 0.44–1.00)
GFR calc non Af Amer: >60 mL/min (ref >60)

## 2015-05-25 NOTE — Progress Notes (Signed)
Pt feeling ok no side effects from AI.

## 2015-05-29 NOTE — Progress Notes (Signed)
[facility]  Telephone:([phone] [phone]     ID: April Mills OB: 10/05/1940  MR#: [id number]  GUY#:[id number]  Patient Care Team: Glendon Axe, MD as PCP - General (Internal Medicine)  CHIEF COMPLAINT/DIAGNOSIS:  1.  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  ER positive (>90%,), PR positive (>90%), HER2neu negative (2+ on IHC but FISH negative and Her2/CEP17 ratio 1.15). 11/17/14 - left breast lumpectomy and SLN study. Then had breast radiation. Started adjuvant hormonal therapy with Anastrozole in end of June 2016.  2. 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    HISTORY OF PRESENT ILLNESS:  Patient returns for continued oncology evaluation she was seen a few months ago. She is currently taking anastrozole, states that she is tolerating this well without major side effects. No bothersome hot flashes. States are chronic arthritis is unchanged, denies new bone pains, new joint pains or stiffness. Remains physically active and ambulatory. Appetite is good, no unintentional weight loss.  No new cough, chest pain, dyspnea, or hemoptysis. Denies feeling any new masses on breast self-exam. She has chronic mild thrombocytopenia likely from methotrexate versus other etiology, denies any bleeding symptoms.   REVIEW OF SYSTEMS:   ROS As in HPI above. In addition, no fevers or chills. No new headaches or focal weakness.  No new mood disturbances. No  sore throat, cough, shortness of breath, sputum, hemoptysis or chest pain. No dizziness or palpitation. No abdominal pain, constipation, diarrhea, dysuria or hematuria. No new skin rash or bleeding symptoms. No new paresthesias in extremities. PS ECOG 0.  PAST MEDICAL HISTORY: Reviewed Past Medical  History  Diagnosis Date  . Arthritis   . Breast cancer ([place])   . Psoriasis           Psoriasis  Psoriatic arthritis  Diverticulitis  Osteoarthritis  History of colon polyps  Chickenpox  Osteoporosis status post Fosamax treatment, Bone density September 2013 revealed Osteopenia   PAST SURGICAL HISTORY:Reviewed         Left wrist fracture repair  Tonsillectomy and adenoidectomy  Right toe procedure for bone removal             11/17/14 - left breast lumpectomy and SLN study for stage I breast Ca.  FAMILY HISTORY:Reviewed Remarkable for diabetes, stroke, hypertension, hyperlipidemia. Father had lung cancer, brother had brain tumor. Denies history of colon breast or ovarian malignancies.  SOCIAL HISTORY: Reviewed Denies smoking, alcohol or decreased usage. Physically active.  No Known Allergies  Current Outpatient Prescriptions  Medication Sig Dispense Refill  . alendronate (FOSAMAX) 70 MG tablet Take 70 mg by mouth once a week. Take with a full glass of water on an empty stomach.    Marland Kitchen anastrozole (ARIMIDEX) 1 MG tablet Take 1 tablet (1 mg total) by mouth daily. 30 tablet 11  . fluticasone (FLONASE) 50 MCG/ACT nasal spray as needed.     . methotrexate (RHEUMATREX) 2.5 MG tablet Take 22.5 mg by mouth once a week. Caution:Chemotherapy. Protect from light.     No current facility-administered medications for this visit.    OBJECTIVE: Filed Vitals:   05/25/15 1136  BP: 145/86  Pulse: 80  Temp: 96.9 F (36.1 C)  Resp: 20     Body mass index is 25.84 kg/(m^2).    GENERAL: Alert and oriented and in no  acute distress. No icterus. HEENT: EOMs intact. No cervical lymphadenopathy. CVS: S1S2, regular LUNGS: Bilaterally clear to auscultation, no rhonchi. ABDOMEN: Soft, nontender. No hepatomegaly clinically.  BREASTS: No dominant masses in either breast. No axillary adenopathy on either side. Exam performed in presence of a nurse. EXTREMITIES: No pedal edema.  LAB RESULTS:      Component Value Date/Time   NA 139 11/09/2014 1257   K 4.7 11/09/2014 1257   CL 105 11/09/2014 1257   CO2 27 11/09/2014 1257   GLUCOSE 110* 11/09/2014 1257   BUN 15 11/09/2014 1257   CREATININE 0.77 05/25/2015 1043   CREATININE 0.85 11/09/2014 1257   CALCIUM 9.9 11/09/2014 1257   PROT 7.2 05/25/2015 1043   ALBUMIN 3.6 05/25/2015 1043   AST 36 05/25/2015 1043   ALT 20 05/25/2015 1043   ALKPHOS 75 05/25/2015 1043   BILITOT 0.4 05/25/2015 1043   GFRNONAA >60 05/25/2015 1043   GFRNONAA >60 11/09/2014 1257   GFRAA >60 05/25/2015 1043   GFRAA >60 11/09/2014 1257   Lab Results  Component Value Date   WBC 4.6 05/25/2015   NEUTROABS 2.7 05/25/2015   HGB 12.5 05/25/2015   HCT 36.9 05/25/2015   MCV 92.8 05/25/2015   PLT 118* 05/25/2015   01/19/15 - serum CA 27-29 normal at 28.1.   STUDIES: 11/17/14  - Left breast lumpectomy and SLN study Surgical Pathology report:   DIAGNOSIS: A.  BREAST, LEFT; LUMPECTOMY WITH NEEDLE LOCALIZATION:  RESIDUAL INVASIVE LOBULAR CARCINOMA OF BREAST, NOTTINGHAM GRADE 2, 0.6 CM IN GREATEST DIMENSION.  FOCAL LOBULAR CARCINOMA IN SITU.  FIBROCYSTIC CHANGES WITH DUCTAL EPITHELIAL HYPERPLASIA.  BIOPSY CHANGES.  NO INVASIVE CARCINOMA SEEN IN MARGINS OF SPECIMEN. B.  LYMPH NODE, LEFT SENTINEL #1; SENTINEL LYMPH NODE BIOPSY:  FOUR LYMPH NODES (0/4), NEGATIVE FOR METASTATIC CARCINOMA. C.  LYMPH NODE, LEFT SENTINEL #2; SENTINEL LYMPH NODE BIOPSY: ONE LYMPH NODE (0/1), NEGATIVE FOR METASTATIC CARCINOMA. D.  LYMPH NODES, LEFT AXILLARY; AXILLARY NODE DISSECTION:  FIBROFATTY TISSUE, NO LYMPHOID TISSUE PRESENT. Comment:  IHC prognostic markers performed on the previous biopsy specimen ([id number]) reveal the following results:  ER POSITIVE (>90%), PR POSITIVE (>90%), AND HER2 EQUIVOCAL (2+ IHC).  HER2 NEGATIVE (ISH). Immunoperoxidase stains performed on formalin fixed and paraffin embedded tissue sections of the lymph nodes (blocks B1 and C1) for cytokeratin AE1/AE3 are  non-reactive within malignant cells supporting the histologic interpretation of negative for metastatic carcinoma. The control stains appropriately. Ductal Carcinoma In Situ (DCIS):   No DCIS is present Tumor Size / Focality:   Tumor Size: Size of Largest Invasive Carcinoma: Greatest Dimension (mm): 0.6cm.. Margins uninvolved by invasive carcinoma.  Total Number of Lymph Nodes Examined: 5. Micro / Macro Metastases: Not identified. STAGE (pTNM) -  Primary Tumor (Invasive Carcinoma) (pT): pT1b:  Tumor > 5 mm but <= 10 mm in greatest dimension.  Regional Lymph Nodes (pN)  pN0: No regional lymph node metastasis identified histologically.    ASSESSMENT / PLAN:   1. 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  ER positive (>90%,), PR positive (>90%), HER2neu negative (2+ on IHC but FISH negative and Her2/CEP17 ratio 1.15).  On 11/17/14 - left breast lumpectomy and SLN study. Then had breast radiation  -  Have reviewed labs are discussed with patient. Overall doing study without any clinical evidence to suggest recurrent or metastatic  breast cancer. She is tolerating hormonal therapy with anastrozole without new side effects, continue this 1 mg by mouth daily, have again explained that this is planned for duration of 5 years since tumor is ER positive. Patient states that she visits with surgeon after next mammogram and also with Dr.Chrystal in a few months. Will therefore see her back at 24 weeks with repeat labs including CBC, Cr, LFT.  2. Osteoporosis - takes Ca+D and Fosamax. Will send request to Dr[facility] for repeat DEXA scan if it has been at least 2 years since last one, since she is on aromatase inhibitor.  3. 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 Patient  reports to get further workup if thrombocytopenia worsens in the future. 4. In between visits, she was advised to call in case of new side effects from Anastrozole, new breast masses felt on self-exam, bleeding or other new symptoms. She is agreeable to this plan.    Leia Alf, MD   05/29/2015 12:59 PM

## 2015-06-05 ENCOUNTER — Telehealth: Payer: Self-pay | Admitting: *Deleted

## 2015-06-05 NOTE — Telephone Encounter (Signed)
Dr. Ma Hillock wanted [place] to contact PCP and ask for dexa scan. I rcvd a dexa scan from [facility] PCP with results from scan from 08/21/14. therfore pt does not need scan at this time.  She is followed for this with her PCP.

## 2015-08-22 DIAGNOSIS — L4050 Arthropathic psoriasis, unspecified: Secondary | ICD-10-CM | POA: Diagnosis not present

## 2015-08-22 DIAGNOSIS — R197 Diarrhea, unspecified: Secondary | ICD-10-CM | POA: Diagnosis not present

## 2015-08-27 ENCOUNTER — Telehealth: Payer: Self-pay | Admitting: *Deleted

## 2015-08-27 ENCOUNTER — Ambulatory Visit: Payer: PPO | Admitting: Radiation Oncology

## 2015-08-27 NOTE — Telephone Encounter (Signed)
Notified patient of change in appointment due to weather.  She repeated back the new date and time of Wed 09-12-15 at 930am.

## 2015-08-29 ENCOUNTER — Other Ambulatory Visit: Payer: Self-pay | Admitting: Surgery

## 2015-08-29 DIAGNOSIS — M25562 Pain in left knee: Secondary | ICD-10-CM | POA: Diagnosis not present

## 2015-08-29 DIAGNOSIS — L4050 Arthropathic psoriasis, unspecified: Secondary | ICD-10-CM | POA: Diagnosis not present

## 2015-08-29 DIAGNOSIS — G8929 Other chronic pain: Secondary | ICD-10-CM | POA: Diagnosis not present

## 2015-08-29 DIAGNOSIS — Z853 Personal history of malignant neoplasm of breast: Secondary | ICD-10-CM

## 2015-08-29 DIAGNOSIS — L409 Psoriasis, unspecified: Secondary | ICD-10-CM | POA: Diagnosis not present

## 2015-09-03 DIAGNOSIS — Z124 Encounter for screening for malignant neoplasm of cervix: Secondary | ICD-10-CM | POA: Diagnosis not present

## 2015-09-03 DIAGNOSIS — Z7689 Persons encountering health services in other specified circumstances: Secondary | ICD-10-CM | POA: Diagnosis not present

## 2015-09-03 DIAGNOSIS — Z853 Personal history of malignant neoplasm of breast: Secondary | ICD-10-CM | POA: Diagnosis not present

## 2015-09-03 DIAGNOSIS — Z0000 Encounter for general adult medical examination without abnormal findings: Secondary | ICD-10-CM | POA: Diagnosis not present

## 2015-09-12 ENCOUNTER — Encounter: Payer: Self-pay | Admitting: Radiation Oncology

## 2015-09-12 ENCOUNTER — Ambulatory Visit
Admission: RE | Admit: 2015-09-12 | Discharge: 2015-09-12 | Disposition: A | Discharge status: Home / Self Care | Payer: PPO | Source: Ambulatory Visit | Attending: Radiation Oncology | Admitting: Radiation Oncology

## 2015-09-12 VITALS — BP 154/66 | HR 72 | Temp 97.3°F | Resp 18 | Wt 130.3 lb

## 2015-09-12 DIAGNOSIS — C50912 Malignant neoplasm of unspecified site of left female breast: Secondary | ICD-10-CM | POA: Diagnosis not present

## 2015-09-12 NOTE — Progress Notes (Signed)
Radiation Oncology Follow up Note  Name: April Mills   Date:   09/12/2015 MRN:  [id number] DOB: 08-31-1940    This 75 y.o. female presents to the clinic today for follow-up of breast cancer stage IA ER/PR positive HER-2/neu not overexpressed invasive lobular carcinoma now out 7 months whole breast radiation.  REFERRING PROVIDER: Glendon Axe, MD  HPI: Patient is a 75 year old female now out 7 months having completed whole breast radiation to her left breast for a T1 N0 ER/PR positive HER-2/neu negative invasive lobular carcinoma. She seen today in routine follow-up and is doing well. Specifically denies any breast tenderness cough or bone pain. Her major complaint has to do with psoriasis which is been a continual problem causing some itching.. She is currently on on letrozole tolerating that well without side effect or complaint. Also on calcium and Fosamax.  COMPLICATIONS OF TREATMENT: none  FOLLOW UP COMPLIANCE: keeps appointments   PHYSICAL EXAM:  BP 154/66 mmHg  Pulse 72  Temp(Src) 97.3 F (36.3 C)  Resp 18  Wt 130 lb 4.7 oz (59.1 kg) Lungs are clear to A&P cardiac examination essentially unremarkable with regular rate and rhythm. No dominant mass or nodularity is noted in either breast in 2 positions examined. Incision is well-healed. No axillary or supraclavicular adenopathy is appreciated. Cosmetic result is excellent. Patient does have psoriasis throughout her skin. Well-developed well-nourished patient in NAD. HEENT reveals PERLA, EOMI, discs not visualized.  Oral cavity is clear. No oral mucosal lesions are identified. Neck is clear without evidence of cervical or supraclavicular adenopathy. Lungs are clear to A&P. Cardiac examination is essentially unremarkable with regular rate and rhythm without murmur rub or thrill. Abdomen is benign with no organomegaly or masses noted. Motor sensory and DTR levels are equal and symmetric in the upper and lower extremities. Cranial nerves II  through XII are grossly intact. Proprioception is intact. No peripheral adenopathy or edema is identified. No motor or sensory levels are noted. Crude visual fields are within normal range.  RADIOLOGY RESULTS: Mammogram has been ordered for February 9  PLAN: Present time patient is doing well with no evidence of disease. I'm please were overall progress. I've asked to see her back in 6 months for follow-up and then will start once your follow-up visits. She continues on letrozole without side effect. Patient knows to call with any concerns.  I would like to take this opportunity for allowing me to participate in the care of your patient.Armstead Peaks., MD

## 2015-09-25 DIAGNOSIS — M542 Cervicalgia: Secondary | ICD-10-CM | POA: Diagnosis not present

## 2015-09-25 DIAGNOSIS — M5032 Other cervical disc degeneration, mid-cervical region, unspecified level: Secondary | ICD-10-CM | POA: Diagnosis not present

## 2015-09-27 ENCOUNTER — Ambulatory Visit
Admission: RE | Admit: 2015-09-27 | Discharge: 2015-09-27 | Disposition: A | Discharge status: Home / Self Care | Payer: PPO | Source: Ambulatory Visit | Attending: Surgery | Admitting: Surgery

## 2015-09-27 ENCOUNTER — Other Ambulatory Visit: Payer: Self-pay | Admitting: Surgery

## 2015-09-27 DIAGNOSIS — Z853 Personal history of malignant neoplasm of breast: Secondary | ICD-10-CM

## 2015-09-27 DIAGNOSIS — Z9889 Other specified postprocedural states: Secondary | ICD-10-CM | POA: Insufficient documentation

## 2015-09-27 DIAGNOSIS — R928 Other abnormal and inconclusive findings on diagnostic imaging of breast: Secondary | ICD-10-CM | POA: Diagnosis not present

## 2015-11-05 DIAGNOSIS — Z853 Personal history of malignant neoplasm of breast: Secondary | ICD-10-CM | POA: Diagnosis not present

## 2015-11-06 ENCOUNTER — Other Ambulatory Visit: Payer: Self-pay | Admitting: *Deleted

## 2015-11-06 DIAGNOSIS — C50919 Malignant neoplasm of unspecified site of unspecified female breast: Secondary | ICD-10-CM

## 2015-11-09 ENCOUNTER — Inpatient Hospital Stay: Payer: PPO | Attending: Internal Medicine

## 2015-11-09 ENCOUNTER — Encounter: Payer: Self-pay | Admitting: Internal Medicine

## 2015-11-09 ENCOUNTER — Inpatient Hospital Stay (HOSPITAL_BASED_OUTPATIENT_CLINIC_OR_DEPARTMENT_OTHER): Payer: PPO | Admitting: Internal Medicine

## 2015-11-09 VITALS — BP 144/78 | HR 85 | Temp 98.6°F | Resp 18 | Ht 62.0 in | Wt 124.1 lb

## 2015-11-09 DIAGNOSIS — M1990 Unspecified osteoarthritis, unspecified site: Secondary | ICD-10-CM | POA: Insufficient documentation

## 2015-11-09 DIAGNOSIS — Z79811 Long term (current) use of aromatase inhibitors: Secondary | ICD-10-CM | POA: Insufficient documentation

## 2015-11-09 DIAGNOSIS — Z170 Estrogen receptor positive status [ER+]: Secondary | ICD-10-CM | POA: Insufficient documentation

## 2015-11-09 DIAGNOSIS — D696 Thrombocytopenia, unspecified: Secondary | ICD-10-CM | POA: Insufficient documentation

## 2015-11-09 DIAGNOSIS — C50919 Malignant neoplasm of unspecified site of unspecified female breast: Secondary | ICD-10-CM

## 2015-11-09 DIAGNOSIS — C50912 Malignant neoplasm of unspecified site of left female breast: Secondary | ICD-10-CM | POA: Diagnosis not present

## 2015-11-09 LAB — CBC WITH DIFFERENTIAL/PLATELET
BASOS ABS: 0 10*3/uL (ref 0–0.1)
Basophils Relative: 1 %
EOS PCT: 1 %
Eosinophils Absolute: 0.1 10*3/uL (ref 0–0.7)
HEMATOCRIT: 35.7 % (ref 35.0–47.0)
Hemoglobin: 12.1 g/dL (ref 12.0–16.0)
LYMPHS PCT: 14 %
Lymphs Abs: 0.8 10*3/uL — ABNORMAL LOW (ref 1.0–3.6)
MCH: 29 pg (ref 26.0–34.0)
MCHC: 34 g/dL (ref 32.0–36.0)
MCV: 85.4 fL (ref 80.0–100.0)
MONO ABS: 0.4 10*3/uL (ref 0.2–0.9)
MONOS PCT: 7 %
NEUTROS ABS: 4.4 10*3/uL (ref 1.4–6.5)
Neutrophils Relative %: 77 %
PLATELETS: 162 10*3/uL (ref 150–440)
RBC: 4.18 MIL/uL (ref 3.80–5.20)
RDW: 14.6 % — AB (ref 11.5–14.5)
WBC: 5.8 10*3/uL (ref 3.6–11.0)

## 2015-11-09 LAB — COMPREHENSIVE METABOLIC PANEL
ALT: 25 U/L (ref 14–54)
ANION GAP: 6 (ref 5–15)
AST: 37 U/L (ref 15–41)
Albumin: 3.4 g/dL — ABNORMAL LOW (ref 3.5–5.0)
Alkaline Phosphatase: 78 U/L (ref 38–126)
BILIRUBIN TOTAL: 0.5 mg/dL (ref 0.3–1.2)
BUN: 15 mg/dL (ref 6–20)
CHLORIDE: 102 mmol/L (ref 101–111)
CO2: 24 mmol/L (ref 22–32)
Calcium: 8.7 mg/dL — ABNORMAL LOW (ref 8.9–10.3)
Creatinine, Ser: 0.82 mg/dL (ref 0.44–1.00)
Glucose, Bld: 183 mg/dL — ABNORMAL HIGH (ref 65–99)
POTASSIUM: 3.6 mmol/L (ref 3.5–5.1)
Sodium: 132 mmol/L — ABNORMAL LOW (ref 135–145)
TOTAL PROTEIN: 7.9 g/dL (ref 6.5–8.1)

## 2015-11-09 NOTE — Progress Notes (Signed)
Pt does her own self breast exam and she did not find anything abnl.  She had her last mammogram 09/27/15 and it was normal. Last bone denisty 08/21/14 and was osteoporosis. She is currently on alendronate once a week. She started a new med for arthritis and it is causing nausea and sometimes diarrhea.  She took otc nausea med this am. She has appt for Dr. Jefm Bryant in couple of weeks and she will tell him then.  I encouraged her to call the office and let them know sooner.

## 2015-11-09 NOTE — Progress Notes (Signed)
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   # Jan 2017- BMD- OSTEOPOROSIS on fosomax [pcp]  INTERVAL HISTORY:  This is my first interaction with the patient since I joined the practice September 2016. I reviewed the patient's prior charts/pertinent labs/imaging in detail; findings are summarized above.   A pleasant 75 year old female patient with above history of lobular cancer stage I currently on adjuvant Arimidex is here for follow-up. Patient is chronic joint pain from arthritis.  Patient denies any new lumps or bumps. She has had abdominal discomfort with intermittent diarrhea. She attributes this to possible new from rheumatoid arthritis medication.  Otherwise no chest pain or cough or shortness of breath.  REVIEW OF SYSTEMS:  A complete 10 point review of system is done which is negative except mentioned above/history of present illness.   PAST MEDICAL HISTORY :  Past Medical History  Diagnosis Date  . Arthritis   . Psoriasis   . Breast cancer ([place]) 09/2014    radiation  . Osteoporosis     PAST SURGICAL HISTORY :   Past Surgical History  Procedure Laterality Date  . Breast biopsy Left 09/28/14    positive  . Breast excisional biopsy Left 11/17/14    lumpectomy    FAMILY HISTORY :   Family History  Problem Relation Age of Onset  . Diabetes Mother   . Lung cancer Father   . Diabetes Sister   . Cancer Brother     went to brain but no one knew the primary    SOCIAL HISTORY:   Social History  Substance Use Topics  . Smoking status: Never Smoker   . Smokeless  tobacco: None  . Alcohol Use: No    ALLERGIES:  has No Known Allergies.  MEDICATIONS:  Current Outpatient Prescriptions  Medication Sig Dispense Refill  . alendronate (FOSAMAX) 70 MG tablet Take 70 mg by mouth once a week. Take with a full glass of water on an empty stomach.    Marland Kitchen anastrozole (ARIMIDEX) 1 MG tablet Take 1 tablet (1 mg total) by mouth daily. 30 tablet 11  . Apremilast (OTEZLA) 30 MG TABS Take by mouth.    . diclofenac (VOLTAREN) 50 MG EC tablet Take by mouth.    . fluticasone (FLONASE) 50 MCG/ACT nasal spray as needed.     . triamcinolone (KENALOG) 0.025 % cream Apply topically.     No current facility-administered medications for this visit.    PHYSICAL EXAMINATION: ECOG PERFORMANCE STATUS: 0 - Asymptomatic  BP 144/78 mmHg  Pulse 85  Temp(Src) 98.6 F (37 C) (Tympanic)  Resp 18  Ht '5\' 2"'  (1.575 m)  Wt 124 lb 1.9 oz (56.3 kg)  BMI 22.70 kg/m2  Filed Weights   11/09/15 1119  Weight: 124 lb 1.9 oz (56.3 kg)    GENERAL: Well-nourished well-developed; Alert, no distress and comfortable.   Alone.  EYES: no pallor or icterus OROPHARYNX: no thrush or ulceration; good dentition  NECK: supple, no masses felt LYMPH:  no palpable lymphadenopathy in the cervical, axillary or inguinal regions LUNGS: clear to auscultation and  No wheeze or crackles HEART/CVS:  regular rate & rhythm and no murmurs; No lower extremity edema ABDOMEN:abdomen soft, non-tender and normal bowel sounds Musculoskeletal:no cyanosis of digits and no clubbing  PSYCH: alert & oriented x 3 with fluent speech NEURO: no focal motor/sensory deficits SKIN:  no rashes or significant lesions  LABORATORY DATA:  I have reviewed the data as listed    Component Value Date/Time   NA 132* 11/09/2015 1036   NA 139 11/09/2014 1257   K 3.6 11/09/2015 1036   K 4.7 11/09/2014 1257   CL 102 11/09/2015 1036   CL 105 11/09/2014 1257   CO2 24 11/09/2015 1036   CO2 27 11/09/2014 1257   GLUCOSE 183*  11/09/2015 1036   GLUCOSE 110* 11/09/2014 1257   BUN 15 11/09/2015 1036   BUN 15 11/09/2014 1257   CREATININE 0.82 11/09/2015 1036   CREATININE 0.85 11/09/2014 1257   CALCIUM 8.7* 11/09/2015 1036   CALCIUM 9.9 11/09/2014 1257   PROT 7.9 11/09/2015 1036   ALBUMIN 3.4* 11/09/2015 1036   AST 37 11/09/2015 1036   ALT 25 11/09/2015 1036   ALKPHOS 78 11/09/2015 1036   BILITOT 0.5 11/09/2015 1036   GFRNONAA >60 11/09/2015 1036   GFRNONAA >60 11/09/2014 1257   GFRAA >60 11/09/2015 1036   GFRAA >60 11/09/2014 1257    No results found for: SPEP, UPEP  Lab Results  Component Value Date   WBC 5.8 11/09/2015   NEUTROABS 4.4 11/09/2015   HGB 12.1 11/09/2015   HCT 35.7 11/09/2015   MCV 85.4 11/09/2015   PLT 162 11/09/2015      Chemistry      Component Value Date/Time   NA 132* 11/09/2015 1036   NA 139 11/09/2014 1257   K 3.6 11/09/2015 1036   K 4.7 11/09/2014 1257   CL 102 11/09/2015 1036   CL 105 11/09/2014 1257   CO2 24 11/09/2015 1036   CO2 27 11/09/2014 1257   BUN 15 11/09/2015 1036   BUN 15 11/09/2014 1257   CREATININE 0.82 11/09/2015 1036   CREATININE 0.85 11/09/2014 1257      Component Value Date/Time   CALCIUM 8.7* 11/09/2015 1036   CALCIUM 9.9 11/09/2014 1257   ALKPHOS 78 11/09/2015 1036   AST 37 11/09/2015 1036   ALT 25 11/09/2015 1036   BILITOT 0.5 11/09/2015 1036        ASSESSMENT & PLAN:   # Stage I lobular cancer LEFT BREAST - currently on adjuvant aromatase inhibitor. Tolerating it well. Clinically no recurrence of recurrence. Patient has recent breast exam with surgery.  # Chronic mild thrombocytopenia- today platelets are normal. Question related to methotrexate.  # Abnormal discomfort/weight loss- question related to rheumatoid medication. Defer to rheumatology for further recommendations.  # Bone density 2017-osteoporosis on Fosamax. Patient not on calcium and vitamin D. Insisted that she should be on calcium and vitamin D twice a day.  #  Patient will follow-up with me in 6 months with labs.  # 15 minutes face-to-face with the patient discussing the above plan of care; more than 50% of time spent on natural history; counseling and coordination.     Cammie Sickle, MD 11/09/2015 11:55 AM

## 2015-11-12 DIAGNOSIS — M25561 Pain in right knee: Secondary | ICD-10-CM | POA: Diagnosis not present

## 2015-11-12 DIAGNOSIS — M25571 Pain in right ankle and joints of right foot: Secondary | ICD-10-CM | POA: Diagnosis not present

## 2015-11-12 DIAGNOSIS — L409 Psoriasis, unspecified: Secondary | ICD-10-CM | POA: Diagnosis not present

## 2015-11-12 DIAGNOSIS — L4050 Arthropathic psoriasis, unspecified: Secondary | ICD-10-CM | POA: Diagnosis not present

## 2015-11-12 DIAGNOSIS — G8929 Other chronic pain: Secondary | ICD-10-CM | POA: Diagnosis not present

## 2015-12-19 DIAGNOSIS — L4050 Arthropathic psoriasis, unspecified: Secondary | ICD-10-CM | POA: Diagnosis not present

## 2015-12-19 DIAGNOSIS — L409 Psoriasis, unspecified: Secondary | ICD-10-CM | POA: Diagnosis not present

## 2015-12-26 DIAGNOSIS — L4050 Arthropathic psoriasis, unspecified: Secondary | ICD-10-CM | POA: Diagnosis not present

## 2015-12-26 DIAGNOSIS — L409 Psoriasis, unspecified: Secondary | ICD-10-CM | POA: Diagnosis not present

## 2016-01-16 ENCOUNTER — Other Ambulatory Visit: Payer: Self-pay | Admitting: *Deleted

## 2016-01-16 DIAGNOSIS — L400 Psoriasis vulgaris: Secondary | ICD-10-CM | POA: Diagnosis not present

## 2016-01-16 MED ORDER — ANASTROZOLE 1 MG PO TABS: 1.0000 mg | ORAL_TABLET | Freq: Every day | ORAL | Status: DC

## 2016-02-05 ENCOUNTER — Ambulatory Visit: Payer: Self-pay | Admitting: Podiatry

## 2016-02-05 DIAGNOSIS — L409 Psoriasis, unspecified: Secondary | ICD-10-CM | POA: Diagnosis not present

## 2016-02-05 DIAGNOSIS — L4050 Arthropathic psoriasis, unspecified: Secondary | ICD-10-CM | POA: Diagnosis not present

## 2016-02-05 DIAGNOSIS — M79672 Pain in left foot: Secondary | ICD-10-CM | POA: Diagnosis not present

## 2016-02-12 DIAGNOSIS — L4050 Arthropathic psoriasis, unspecified: Secondary | ICD-10-CM | POA: Diagnosis not present

## 2016-02-12 DIAGNOSIS — M79672 Pain in left foot: Secondary | ICD-10-CM | POA: Diagnosis not present

## 2016-03-27 DIAGNOSIS — D692 Other nonthrombocytopenic purpura: Secondary | ICD-10-CM | POA: Diagnosis not present

## 2016-03-27 DIAGNOSIS — L400 Psoriasis vulgaris: Secondary | ICD-10-CM | POA: Diagnosis not present

## 2016-05-07 ENCOUNTER — Inpatient Hospital Stay (HOSPITAL_BASED_OUTPATIENT_CLINIC_OR_DEPARTMENT_OTHER): Payer: PPO

## 2016-05-07 ENCOUNTER — Encounter: Payer: Self-pay | Admitting: Internal Medicine

## 2016-05-07 ENCOUNTER — Inpatient Hospital Stay: Payer: PPO | Attending: Internal Medicine | Admitting: Internal Medicine

## 2016-05-07 DIAGNOSIS — Z79811 Long term (current) use of aromatase inhibitors: Secondary | ICD-10-CM | POA: Diagnosis not present

## 2016-05-07 DIAGNOSIS — F419 Anxiety disorder, unspecified: Secondary | ICD-10-CM

## 2016-05-07 DIAGNOSIS — F5102 Adjustment insomnia: Secondary | ICD-10-CM | POA: Insufficient documentation

## 2016-05-07 DIAGNOSIS — D696 Thrombocytopenia, unspecified: Secondary | ICD-10-CM | POA: Insufficient documentation

## 2016-05-07 DIAGNOSIS — Z170 Estrogen receptor positive status [ER+]: Secondary | ICD-10-CM | POA: Diagnosis not present

## 2016-05-07 DIAGNOSIS — M818 Other osteoporosis without current pathological fracture: Secondary | ICD-10-CM | POA: Insufficient documentation

## 2016-05-07 DIAGNOSIS — Z923 Personal history of irradiation: Secondary | ICD-10-CM | POA: Diagnosis not present

## 2016-05-07 DIAGNOSIS — C50812 Malignant neoplasm of overlapping sites of left female breast: Secondary | ICD-10-CM | POA: Diagnosis not present

## 2016-05-07 DIAGNOSIS — Z9221 Personal history of antineoplastic chemotherapy: Secondary | ICD-10-CM | POA: Insufficient documentation

## 2016-05-07 DIAGNOSIS — C50919 Malignant neoplasm of unspecified site of unspecified female breast: Secondary | ICD-10-CM

## 2016-05-07 LAB — CBC WITH DIFFERENTIAL/PLATELET
Basophils Absolute: 0 10*3/uL (ref 0–0.1)
Basophils Relative: 1 %
EOS ABS: 0.1 10*3/uL (ref 0–0.7)
Eosinophils Relative: 2 %
HEMATOCRIT: 36 % (ref 35.0–47.0)
HEMOGLOBIN: 12.2 g/dL (ref 12.0–16.0)
LYMPHS ABS: 1.2 10*3/uL (ref 1.0–3.6)
Lymphocytes Relative: 26 %
MCH: 28.7 pg (ref 26.0–34.0)
MCHC: 33.9 g/dL (ref 32.0–36.0)
MCV: 84.5 fL (ref 80.0–100.0)
Monocytes Absolute: 0.5 10*3/uL (ref 0.2–0.9)
Monocytes Relative: 10 %
NEUTROS ABS: 2.8 10*3/uL (ref 1.4–6.5)
NEUTROS PCT: 61 %
Platelets: 142 10*3/uL — ABNORMAL LOW (ref 150–440)
RBC: 4.26 MIL/uL (ref 3.80–5.20)
RDW: 15.6 % — ABNORMAL HIGH (ref 11.5–14.5)
WBC: 4.6 10*3/uL (ref 3.6–11.0)

## 2016-05-07 LAB — COMPREHENSIVE METABOLIC PANEL
ALBUMIN: 3.9 g/dL (ref 3.5–5.0)
ALK PHOS: 54 U/L (ref 38–126)
ALT: 18 U/L (ref 14–54)
AST: 35 U/L (ref 15–41)
Anion gap: 7 (ref 5–15)
BUN: 18 mg/dL (ref 6–20)
CALCIUM: 9.3 mg/dL (ref 8.9–10.3)
CO2: 26 mmol/L (ref 22–32)
CREATININE: 0.79 mg/dL (ref 0.44–1.00)
Chloride: 105 mmol/L (ref 101–111)
GFR calc non Af Amer: >60 mL/min (ref >60)
Glucose, Bld: 121 mg/dL — ABNORMAL HIGH (ref 65–99)
Potassium: 4.3 mmol/L (ref 3.5–5.1)
SODIUM: 138 mmol/L (ref 135–145)
TOTAL PROTEIN: 7.6 g/dL (ref 6.5–8.1)
Total Bilirubin: 0.5 mg/dL (ref 0.3–1.2)

## 2016-05-07 MED ORDER — ALPRAZOLAM 0.25 MG PO TABS: 0.2500 mg | ORAL_TABLET | Freq: Every evening | ORAL | 0 refills | Status: DC | PRN

## 2016-05-07 NOTE — Progress Notes (Signed)
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 versus other etiology  # Jan 2017- BMD- OSTEOPOROSIS on fosomax [pcp]  # psoriatic arthritis on Humira [july 2017; Dr.Kernodle]     Breast cancer ([place])   02/06/2015 Initial Diagnosis    Breast cancer ([place])      Cancer of overlapping sites of left female breast [facility])     Oncology History   # FEB 2016- LOBULAR CA [pT1b sn] ER positive (>90%,), PR positive (>90%), HER2neu negative [2+ on IHC but FISH negative and Her2/CEP17 ratio 1.15]; no Chemo; s/p RT; June 2016  Started Anastrazole  # Chronic mild thrombocytopenia 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   Denies any hot flashes. Patient is chronic joint pain from arthritis-not any worse.  Patient denies any new lumps or bumps. Otherwise no chest pain or cough or shortness of breath. She has been taken off her methotrexate.  She however complains of difficulty sleeping at night because of stress at home. Heightened anxiety.  REVIEW OF SYSTEMS:  A complete 10 point review of system is done which is negative except mentioned above/history of present illness.   PAST  MEDICAL HISTORY :  Past Medical History:  Diagnosis Date  . Arthritis   . Breast cancer ([place]) 09/2014   radiation  . Osteoporosis   . Psoriasis     PAST SURGICAL HISTORY :   Past Surgical History:  Procedure Laterality Date  . BREAST BIOPSY Left 09/28/14   positive  . BREAST EXCISIONAL BIOPSY Left 11/17/14   lumpectomy    FAMILY HISTORY :   Family History  Problem Relation Age of Onset  . Diabetes Mother   . Lung cancer Father   . Diabetes Sister   . Cancer Brother     went to brain but no one knew the primary    SOCIAL HISTORY:   Social History  Substance Use Topics  . Smoking status: Never Smoker  . Smokeless tobacco: Never Used  . Alcohol use No    ALLERGIES:  has No Known Allergies.  MEDICATIONS:  Current Outpatient Prescriptions  Medication Sig Dispense Refill  . alendronate (FOSAMAX) 70 MG tablet Take 70 mg by mouth once a week. Take with a full glass of water on an empty stomach.    Marland Kitchen anastrozole (ARIMIDEX) 1 MG tablet Take 1 tablet (1 mg total) by mouth daily. 90 tablet 1  . triamcinolone (KENALOG) 0.025 % cream Apply topically.    . ALPRAZolam (XANAX) 0.25 MG tablet Take 1 tablet (0.25 mg total) by mouth at bedtime as needed for anxiety. 30 tablet 0  . fluticasone (FLONASE) 50 MCG/ACT nasal spray as needed.  No current facility-administered medications for this visit.     PHYSICAL EXAMINATION: ECOG PERFORMANCE STATUS: 0 - Asymptomatic  BP (!) 155/69 (BP Location: Right Arm, Patient Position: Sitting)   Pulse 75   Temp (!) 96.7 F (35.9 C) (Tympanic)   Resp 17   Ht '5\' 2"'  (1.575 m)   Wt 124 lb 3.2 oz (56.3 kg)   BMI 22.72 kg/m   Filed Weights   05/07/16 1059  Weight: 124 lb 3.2 oz (56.3 kg)    GENERAL: Well-nourished well-developed; Alert, no distress and comfortable.   Alone.  EYES: no pallor or icterus OROPHARYNX: no thrush or ulceration; good dentition  NECK: supple, no masses felt LYMPH:  no palpable lymphadenopathy in the cervical,  axillary or inguinal regions LUNGS: clear to auscultation and  No wheeze or crackles HEART/CVS: regular rate & rhythm and no murmurs; No lower extremity edema ABDOMEN:abdomen soft, non-tender and normal bowel sounds Musculoskeletal:no cyanosis of digits and no clubbing  PSYCH: alert & oriented x 3 with fluent speech NEURO: no focal motor/sensory deficits SKIN:  no rashes or significant lesions  Right and left BREAST exam [in the presence of nurse]- no unusual skin changes or dominant masses felt. Surgical scars noted.    LABORATORY DATA:  I have reviewed the data as listed    Component Value Date/Time   NA 138 05/07/2016 1017   NA 139 11/09/2014 1257   K 4.3 05/07/2016 1017   K 4.7 11/09/2014 1257   CL 105 05/07/2016 1017   CL 105 11/09/2014 1257   CO2 26 05/07/2016 1017   CO2 27 11/09/2014 1257   GLUCOSE 121 (H) 05/07/2016 1017   GLUCOSE 110 (H) 11/09/2014 1257   BUN 18 05/07/2016 1017   BUN 15 11/09/2014 1257   CREATININE 0.79 05/07/2016 1017   CREATININE 0.85 11/09/2014 1257   CALCIUM 9.3 05/07/2016 1017   CALCIUM 9.9 11/09/2014 1257   PROT 7.6 05/07/2016 1017   ALBUMIN 3.9 05/07/2016 1017   AST 35 05/07/2016 1017   ALT 18 05/07/2016 1017   ALKPHOS 54 05/07/2016 1017   BILITOT 0.5 05/07/2016 1017   GFRNONAA >60 05/07/2016 1017   GFRNONAA >60 11/09/2014 1257   GFRAA >60 05/07/2016 1017   GFRAA >60 11/09/2014 1257    No results found for: SPEP, UPEP  Lab Results  Component Value Date   WBC 4.6 05/07/2016   NEUTROABS 2.8 05/07/2016   HGB 12.2 05/07/2016   HCT 36.0 05/07/2016   MCV 84.5 05/07/2016   PLT 142 (L) 05/07/2016      Chemistry      Component Value Date/Time   NA 138 05/07/2016 1017   NA 139 11/09/2014 1257   K 4.3 05/07/2016 1017   K 4.7 11/09/2014 1257   CL 105 05/07/2016 1017   CL 105 11/09/2014 1257   CO2 26 05/07/2016 1017   CO2 27 11/09/2014 1257   BUN 18 05/07/2016 1017   BUN 15 11/09/2014 1257   CREATININE 0.79 05/07/2016 1017    CREATININE 0.85 11/09/2014 1257      Component Value Date/Time   CALCIUM 9.3 05/07/2016 1017   CALCIUM 9.9 11/09/2014 1257   ALKPHOS 54 05/07/2016 1017   AST 35 05/07/2016 1017   ALT 18 05/07/2016 1017   BILITOT 0.5 05/07/2016 1017        ASSESSMENT & PLAN:   Cancer of overlapping sites of left female breast ([place]) # Stage I lobular cancer LEFT BREAST - currently on adjuvant aromatase inhibitor. Tolerating  it well. Clinically no recurrence of recurrence. mammo feb 2017-N.   # Chronic mild thrombocytopenia- ? Mxt [currently off ]- improved; 143.   # Bone density 2017-osteoporosis on Fosamax. 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 Xanax discussed re: dizziness/falls; 30 day; then follow up with PCP.   # Patient will follow-up with me in 6 months with labs.order mammo.       Cammie Sickle, MD 05/07/2016 12:53 PM

## 2016-05-07 NOTE — Assessment & Plan Note (Signed)
#   Stage I lobular cancer LEFT BREAST - currently on adjuvant aromatase inhibitor. Tolerating it well. Clinically no recurrence of recurrence. mammo feb 2017-N.   # Chronic mild thrombocytopenia- ? Mxt [currently off ]- improved; 143.   # Bone density 2017-osteoporosis on Fosamax. 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 Xanax discussed re: dizziness/falls; 30 day; then follow up with PCP.   # Patient will follow-up with me in 6 months with labs.order mammo.

## 2016-05-07 NOTE — Progress Notes (Signed)
Per pt last mammo Feb 2017.  Pt complains today of not being able to sleep at night and has tried [employer] drugs.  No other concerns at this time.

## 2016-07-07 DIAGNOSIS — L400 Psoriasis vulgaris: Secondary | ICD-10-CM | POA: Diagnosis not present

## 2016-07-20 ENCOUNTER — Other Ambulatory Visit: Payer: Self-pay | Admitting: Internal Medicine

## 2016-07-31 DIAGNOSIS — L409 Psoriasis, unspecified: Secondary | ICD-10-CM | POA: Diagnosis not present

## 2016-07-31 DIAGNOSIS — M150 Primary generalized (osteo)arthritis: Secondary | ICD-10-CM | POA: Diagnosis not present

## 2016-07-31 DIAGNOSIS — L4050 Arthropathic psoriasis, unspecified: Secondary | ICD-10-CM | POA: Diagnosis not present

## 2016-07-31 DIAGNOSIS — G8929 Other chronic pain: Secondary | ICD-10-CM | POA: Diagnosis not present

## 2016-07-31 DIAGNOSIS — M25561 Pain in right knee: Secondary | ICD-10-CM | POA: Diagnosis not present

## 2016-09-10 ENCOUNTER — Encounter: Payer: Self-pay | Admitting: Radiation Oncology

## 2016-09-10 ENCOUNTER — Ambulatory Visit
Admission: RE | Admit: 2016-09-10 | Discharge: 2016-09-10 | Disposition: A | Discharge status: Home / Self Care | Payer: PPO | Source: Ambulatory Visit | Attending: Radiation Oncology | Admitting: Radiation Oncology

## 2016-09-10 VITALS — BP 146/70 | HR 83 | Temp 95.6°F | Resp 18 | Wt 129.6 lb

## 2016-09-10 DIAGNOSIS — Z79811 Long term (current) use of aromatase inhibitors: Secondary | ICD-10-CM | POA: Diagnosis not present

## 2016-09-10 DIAGNOSIS — C50812 Malignant neoplasm of overlapping sites of left female breast: Secondary | ICD-10-CM | POA: Insufficient documentation

## 2016-09-10 DIAGNOSIS — Z923 Personal history of irradiation: Secondary | ICD-10-CM | POA: Insufficient documentation

## 2016-09-10 DIAGNOSIS — Z170 Estrogen receptor positive status [ER+]: Secondary | ICD-10-CM | POA: Diagnosis not present

## 2016-09-10 NOTE — Progress Notes (Signed)
Radiation Oncology Follow up Note  Name: April Mills   Date:   09/10/2016 MRN:  [id number] DOB: 09/21/1940    This 76 y.o. female presents to the clinic today for 18 month follow-up status post whole breast radiation to her left breast for stage I (T1 be N0 M0) ER/PR positive PR positive HER-2/neu negative invasive lobular carcinoma.Marland Kitchen  REFERRING PROVIDER: Glendon Axe, MD  HPI: Patient is a 76 year old female now out a year and a half having completed whole breast radiation to her left breast for stage I ER/PR positive HER-2/neu negative invasive lobular carcinoma. She is currently on anastrozole tolerating that well without side effect.. She specifically denies breast tenderness cough or bone pain. Her last mammograms were in February BI-RADS 2 benign she scheduled for follow-up mammograms this February.  COMPLICATIONS OF TREATMENT: none  FOLLOW UP COMPLIANCE: keeps appointments   PHYSICAL EXAM:  BP (!) 146/70   Pulse 83   Temp (!) 95.6 F (35.3 C)   Resp 18   Wt 129 lb 10.1 oz (58.8 kg)   BMI 23.71 kg/m  Lungs are clear to A&P cardiac examination essentially unremarkable with regular rate and rhythm. No dominant mass or nodularity is noted in either breast in 2 positions examined. Incision is well-healed. No axillary or supraclavicular adenopathy is appreciated. Cosmetic result is excellent. Well-developed well-nourished patient in NAD. HEENT reveals PERLA, EOMI, discs not visualized.  Oral cavity is clear. No oral mucosal lesions are identified. Neck is clear without evidence of cervical or supraclavicular adenopathy. Lungs are clear to A&P. Cardiac examination is essentially unremarkable with regular rate and rhythm without murmur rub or thrill. Abdomen is benign with no organomegaly or masses noted. Motor sensory and DTR levels are equal and symmetric in the upper and lower extremities. Cranial nerves II through XII are grossly intact. Proprioception is intact. No peripheral  adenopathy or edema is identified. No motor or sensory levels are noted. Crude visual fields are within normal range.  RADIOLOGY RESULTS: Most recent mammograms are reviewed  PLAN: Present time patient is doing well with no evidence of disease. I'm please were overall progress. She continues on anastrozole without side effect. She scheduled next month for mammogram. I've asked to see her back in 1 year for follow-up. She knows to call sooner with any concerns.  I would like to take this opportunity to thank you for allowing me to participate in the care of your patient.Armstead Peaks., MD

## 2016-09-29 ENCOUNTER — Ambulatory Visit
Admission: RE | Admit: 2016-09-29 | Discharge: 2016-09-29 | Disposition: A | Discharge status: Home / Self Care | Payer: PPO | Source: Ambulatory Visit | Attending: Internal Medicine | Admitting: Internal Medicine

## 2016-09-29 ENCOUNTER — Other Ambulatory Visit: Payer: Self-pay | Admitting: Internal Medicine

## 2016-09-29 DIAGNOSIS — Z9889 Other specified postprocedural states: Secondary | ICD-10-CM | POA: Insufficient documentation

## 2016-09-29 DIAGNOSIS — C50812 Malignant neoplasm of overlapping sites of left female breast: Secondary | ICD-10-CM

## 2016-09-29 DIAGNOSIS — R928 Other abnormal and inconclusive findings on diagnostic imaging of breast: Secondary | ICD-10-CM | POA: Diagnosis not present

## 2016-09-29 HISTORY — DX: Personal history of irradiation: Z92.3

## 2016-10-09 DIAGNOSIS — H6691 Otitis media, unspecified, right ear: Secondary | ICD-10-CM | POA: Diagnosis not present

## 2016-10-24 DIAGNOSIS — J029 Acute pharyngitis, unspecified: Secondary | ICD-10-CM | POA: Diagnosis not present

## 2016-10-24 DIAGNOSIS — H6982 Other specified disorders of Eustachian tube, left ear: Secondary | ICD-10-CM | POA: Diagnosis not present

## 2016-10-24 DIAGNOSIS — H9202 Otalgia, left ear: Secondary | ICD-10-CM | POA: Diagnosis not present

## 2016-11-05 ENCOUNTER — Inpatient Hospital Stay: Payer: PPO | Attending: Internal Medicine | Admitting: Internal Medicine

## 2016-11-05 ENCOUNTER — Inpatient Hospital Stay (HOSPITAL_BASED_OUTPATIENT_CLINIC_OR_DEPARTMENT_OTHER): Payer: PPO

## 2016-11-05 VITALS — BP 131/69 | HR 88 | Temp 96.9°F | Resp 18 | Wt 129.0 lb

## 2016-11-05 DIAGNOSIS — M810 Age-related osteoporosis without current pathological fracture: Secondary | ICD-10-CM | POA: Diagnosis not present

## 2016-11-05 DIAGNOSIS — Z79811 Long term (current) use of aromatase inhibitors: Secondary | ICD-10-CM

## 2016-11-05 DIAGNOSIS — Z170 Estrogen receptor positive status [ER+]: Secondary | ICD-10-CM | POA: Diagnosis not present

## 2016-11-05 DIAGNOSIS — Z923 Personal history of irradiation: Secondary | ICD-10-CM | POA: Insufficient documentation

## 2016-11-05 DIAGNOSIS — L4050 Arthropathic psoriasis, unspecified: Secondary | ICD-10-CM | POA: Insufficient documentation

## 2016-11-05 DIAGNOSIS — D696 Thrombocytopenia, unspecified: Secondary | ICD-10-CM | POA: Diagnosis not present

## 2016-11-05 DIAGNOSIS — C50812 Malignant neoplasm of overlapping sites of left female breast: Secondary | ICD-10-CM | POA: Diagnosis not present

## 2016-11-05 LAB — CBC WITH DIFFERENTIAL/PLATELET
Basophils Absolute: 0 10*3/uL (ref 0–0.1)
Basophils Relative: 1 %
Eosinophils Absolute: 0.4 10*3/uL (ref 0–0.7)
Eosinophils Relative: 7 %
HEMATOCRIT: 37.3 % (ref 35.0–47.0)
Hemoglobin: 12.7 g/dL (ref 12.0–16.0)
LYMPHS PCT: 32 %
Lymphs Abs: 1.9 10*3/uL (ref 1.0–3.6)
MCH: 29.6 pg (ref 26.0–34.0)
MCHC: 34 g/dL (ref 32.0–36.0)
MCV: 87 fL (ref 80.0–100.0)
MONO ABS: 0.5 10*3/uL (ref 0.2–0.9)
Monocytes Relative: 8 %
NEUTROS ABS: 3.3 10*3/uL (ref 1.4–6.5)
Neutrophils Relative %: 52 %
Platelets: 126 10*3/uL — ABNORMAL LOW (ref 150–440)
RBC: 4.29 MIL/uL (ref 3.80–5.20)
RDW: 15 % — AB (ref 11.5–14.5)
WBC: 6.1 10*3/uL (ref 3.6–11.0)

## 2016-11-05 LAB — COMPREHENSIVE METABOLIC PANEL
ALT: 24 U/L (ref 14–54)
AST: 36 U/L (ref 15–41)
Albumin: 3.6 g/dL (ref 3.5–5.0)
Alkaline Phosphatase: 55 U/L (ref 38–126)
Anion gap: 7 (ref 5–15)
BUN: 17 mg/dL (ref 6–20)
CO2: 27 mmol/L (ref 22–32)
CREATININE: 0.92 mg/dL (ref 0.44–1.00)
Calcium: 9.3 mg/dL (ref 8.9–10.3)
Chloride: 104 mmol/L (ref 101–111)
GFR, EST NON AFRICAN AMERICAN: 59 mL/min — AB (ref >60)
Glucose, Bld: 129 mg/dL — ABNORMAL HIGH (ref 65–99)
POTASSIUM: 4.1 mmol/L (ref 3.5–5.1)
Sodium: 138 mmol/L (ref 135–145)
TOTAL PROTEIN: 7.6 g/dL (ref 6.5–8.1)
Total Bilirubin: 0.5 mg/dL (ref 0.3–1.2)

## 2016-11-05 NOTE — Assessment & Plan Note (Addendum)
#   Stage I lobular cancer LEFT BREAST - currently on adjuvant arimidex. Tolerating it well. Clinically no recurrence of recurrence. mammo feb 2018-N.   # Chronic mild thrombocytopenia- question etiology. Today platelets 126.. Monitor for now.  # Bone density 2017-osteoporosis on Fosamax. Recommend again ca+ vit DBID  # Patient will follow-up with me in 6 months with labs.

## 2016-11-05 NOTE — Progress Notes (Signed)
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 versus other etiology  # Jan 2017- BMD- OSTEOPOROSIS on fosomax [pcp]  # psoriatic arthritis on Humira [july 2017; Dr.Kernodle]     Carcinoma of overlapping sites of left breast in female, estrogen receptor positive [facility])     Oncology History   # FEB 2016- LOBULAR CA [pT1b sn] ER positive (>90%,), PR positive (>90%), HER2neu negative [2+ on IHC but FISH negative and Her2/CEP17 ratio 1.15]; no Chemo; s/p RT; June 2016  Started Anastrazole  # Chronic mild 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  Denies any hot flashes. Patient is chronic joint pain from arthritis-not any worse.  Patient denies any new lumps or bumps. Otherwise no chest pain or cough or shortness of breath. Denies any bleeding.   REVIEW OF SYSTEMS:  A complete 10 point review of system is done which is negative except mentioned above/history of present illness.   PAST MEDICAL HISTORY :  Past Medical History:  Diagnosis Date  . Arthritis   . Breast cancer ([place]) 09/2014   LT LUMPECTOMY  . Osteoporosis   . Personal history of radiation therapy 2016   BREAST CA  . Psoriasis     PAST  SURGICAL HISTORY :   Past Surgical History:  Procedure Laterality Date  . BREAST BIOPSY Left 09/28/14   positive    FAMILY HISTORY :   Family History  Problem Relation Age of Onset  . Diabetes Mother   . Lung cancer Father   . Diabetes Sister   . Cancer Brother     went to brain but no one knew the primary  . Breast cancer Neg Hx     SOCIAL HISTORY:   Social History  Substance Use Topics  . Smoking status: Never Smoker  . Smokeless tobacco: Never Used  . Alcohol use No    ALLERGIES:  has No Known Allergies.  MEDICATIONS:  Current Outpatient Prescriptions  Medication Sig Dispense Refill  . Adalimumab (HUMIRA) 40 MG/0.8ML PSKT Inject into the skin every 14 (fourteen) days.    Marland Kitchen alendronate (FOSAMAX) 70 MG tablet Take 70 mg by mouth once a week. Take with a full glass of water on an empty stomach.    Marland Kitchen anastrozole (ARIMIDEX) 1 MG tablet TAKE ONE (1) TABLET BY MOUTH EVERY DAY 90 tablet 1  . diclofenac sodium (VOLTAREN) 1 % GEL Apply topically.    . fluticasone (FLONASE) 50 MCG/ACT nasal spray as needed.      No current facility-administered medications for this visit.     PHYSICAL EXAMINATION: ECOG PERFORMANCE STATUS: 0 - Asymptomatic  BP 131/69 (BP Location: Right Arm, Patient Position: Sitting)   Pulse 88   Temp (!) 96.9 F (36.1 C) (Tympanic)  Resp 18   Wt 129 lb (58.5 kg)   SpO2 98%   BMI 23.59 kg/m   Filed Weights   11/05/16 1114  Weight: 129 lb (58.5 kg)    GENERAL: Well-nourished well-developed; Alert, no distress and comfortable.   Alone.  EYES: no pallor or icterus OROPHARYNX: no thrush or ulceration; good dentition  NECK: supple, no masses felt LYMPH:  no palpable lymphadenopathy in the cervical, axillary or inguinal regions LUNGS: clear to auscultation and  No wheeze or crackles HEART/CVS: regular rate & rhythm and no murmurs; No lower extremity edema ABDOMEN:abdomen soft, non-tender and normal bowel sounds Musculoskeletal:no cyanosis of digits  and no clubbing  PSYCH: alert & oriented x 3 with fluent speech NEURO: no focal motor/sensory deficits SKIN:  no rashes or significant lesions  Right and left BREAST exam [in the presence of nurse]- no unusual skin changes or dominant masses felt. Surgical scars noted.    LABORATORY DATA:  I have reviewed the data as listed    Component Value Date/Time   NA 138 11/05/2016 1045   NA 139 11/09/2014 1257   K 4.1 11/05/2016 1045   K 4.7 11/09/2014 1257   CL 104 11/05/2016 1045   CL 105 11/09/2014 1257   CO2 27 11/05/2016 1045   CO2 27 11/09/2014 1257   GLUCOSE 129 (H) 11/05/2016 1045   GLUCOSE 110 (H) 11/09/2014 1257   BUN 17 11/05/2016 1045   BUN 15 11/09/2014 1257   CREATININE 0.92 11/05/2016 1045   CREATININE 0.85 11/09/2014 1257   CALCIUM 9.3 11/05/2016 1045   CALCIUM 9.9 11/09/2014 1257   PROT 7.6 11/05/2016 1045   ALBUMIN 3.6 11/05/2016 1045   AST 36 11/05/2016 1045   ALT 24 11/05/2016 1045   ALKPHOS 55 11/05/2016 1045   BILITOT 0.5 11/05/2016 1045   GFRNONAA 59 (L) 11/05/2016 1045   GFRNONAA >60 11/09/2014 1257   GFRAA >60 11/05/2016 1045   GFRAA >60 11/09/2014 1257    No results found for: SPEP, UPEP  Lab Results  Component Value Date   WBC 6.1 11/05/2016   NEUTROABS 3.3 11/05/2016   HGB 12.7 11/05/2016   HCT 37.3 11/05/2016   MCV 87.0 11/05/2016   PLT 126 (L) 11/05/2016      Chemistry      Component Value Date/Time   NA 138 11/05/2016 1045   NA 139 11/09/2014 1257   K 4.1 11/05/2016 1045   K 4.7 11/09/2014 1257   CL 104 11/05/2016 1045   CL 105 11/09/2014 1257   CO2 27 11/05/2016 1045   CO2 27 11/09/2014 1257   BUN 17 11/05/2016 1045   BUN 15 11/09/2014 1257   CREATININE 0.92 11/05/2016 1045   CREATININE 0.85 11/09/2014 1257      Component Value Date/Time   CALCIUM 9.3 11/05/2016 1045   CALCIUM 9.9 11/09/2014 1257   ALKPHOS 55 11/05/2016 1045   AST 36 11/05/2016 1045   ALT 24 11/05/2016 1045   BILITOT 0.5 11/05/2016 1045         ASSESSMENT & PLAN:   Carcinoma of overlapping sites of left breast in female, estrogen receptor positive ([place]) # Stage I lobular cancer LEFT BREAST - currently on adjuvant arimidex. Tolerating it well. Clinically no recurrence of recurrence. mammo feb 2018-N.   # Chronic mild thrombocytopenia- question etiology. Today platelets 126.. Monitor for now.  # Bone density 2017-osteoporosis on Fosamax. Recommend again ca+ vit DBID  # Patient will follow-up with me in 6 months with labs.  Cammie Sickle, MD 11/05/2016 1:02 PM

## 2016-11-05 NOTE — Progress Notes (Signed)
Patient here today for follow up.  Patient states no new concerns today  

## 2017-01-05 DIAGNOSIS — Z79899 Other long term (current) drug therapy: Secondary | ICD-10-CM | POA: Diagnosis not present

## 2017-01-05 DIAGNOSIS — L570 Actinic keratosis: Secondary | ICD-10-CM | POA: Diagnosis not present

## 2017-01-05 DIAGNOSIS — L400 Psoriasis vulgaris: Secondary | ICD-10-CM | POA: Diagnosis not present

## 2017-01-05 DIAGNOSIS — X32XXXA Exposure to sunlight, initial encounter: Secondary | ICD-10-CM | POA: Diagnosis not present

## 2017-01-25 ENCOUNTER — Other Ambulatory Visit: Payer: Self-pay | Admitting: Internal Medicine

## 2017-02-03 DIAGNOSIS — M25562 Pain in left knee: Secondary | ICD-10-CM | POA: Diagnosis not present

## 2017-02-03 DIAGNOSIS — L4050 Arthropathic psoriasis, unspecified: Secondary | ICD-10-CM | POA: Diagnosis not present

## 2017-02-03 DIAGNOSIS — M79641 Pain in right hand: Secondary | ICD-10-CM | POA: Diagnosis not present

## 2017-02-03 DIAGNOSIS — G8929 Other chronic pain: Secondary | ICD-10-CM | POA: Diagnosis not present

## 2017-02-03 DIAGNOSIS — L409 Psoriasis, unspecified: Secondary | ICD-10-CM | POA: Diagnosis not present

## 2017-02-03 DIAGNOSIS — M150 Primary generalized (osteo)arthritis: Secondary | ICD-10-CM | POA: Diagnosis not present

## 2017-03-03 DIAGNOSIS — R739 Hyperglycemia, unspecified: Secondary | ICD-10-CM | POA: Diagnosis not present

## 2017-03-03 DIAGNOSIS — M810 Age-related osteoporosis without current pathological fracture: Secondary | ICD-10-CM | POA: Diagnosis not present

## 2017-03-16 DIAGNOSIS — E559 Vitamin D deficiency, unspecified: Secondary | ICD-10-CM | POA: Diagnosis not present

## 2017-03-16 DIAGNOSIS — Z23 Encounter for immunization: Secondary | ICD-10-CM | POA: Diagnosis not present

## 2017-03-16 DIAGNOSIS — M810 Age-related osteoporosis without current pathological fracture: Secondary | ICD-10-CM | POA: Diagnosis not present

## 2017-03-16 DIAGNOSIS — Z0000 Encounter for general adult medical examination without abnormal findings: Secondary | ICD-10-CM | POA: Diagnosis not present

## 2017-03-26 DIAGNOSIS — M810 Age-related osteoporosis without current pathological fracture: Secondary | ICD-10-CM | POA: Diagnosis not present

## 2017-04-25 ENCOUNTER — Other Ambulatory Visit: Payer: Self-pay | Admitting: Internal Medicine

## 2017-05-06 ENCOUNTER — Inpatient Hospital Stay: Payer: PPO | Attending: Internal Medicine | Admitting: Internal Medicine

## 2017-05-06 ENCOUNTER — Inpatient Hospital Stay: Payer: PPO

## 2017-05-06 VITALS — BP 148/77 | HR 84 | Temp 97.1°F | Resp 16 | Wt 135.4 lb

## 2017-05-06 DIAGNOSIS — Z79899 Other long term (current) drug therapy: Secondary | ICD-10-CM | POA: Diagnosis not present

## 2017-05-06 DIAGNOSIS — Z923 Personal history of irradiation: Secondary | ICD-10-CM | POA: Insufficient documentation

## 2017-05-06 DIAGNOSIS — Z79811 Long term (current) use of aromatase inhibitors: Secondary | ICD-10-CM | POA: Insufficient documentation

## 2017-05-06 DIAGNOSIS — M818 Other osteoporosis without current pathological fracture: Secondary | ICD-10-CM | POA: Insufficient documentation

## 2017-05-06 DIAGNOSIS — Z170 Estrogen receptor positive status [ER+]: Secondary | ICD-10-CM | POA: Diagnosis not present

## 2017-05-06 DIAGNOSIS — C50812 Malignant neoplasm of overlapping sites of left female breast: Secondary | ICD-10-CM | POA: Diagnosis not present

## 2017-05-06 DIAGNOSIS — D696 Thrombocytopenia, unspecified: Secondary | ICD-10-CM | POA: Insufficient documentation

## 2017-05-06 LAB — CBC WITH DIFFERENTIAL/PLATELET
BASOS PCT: 1 %
Basophils Absolute: 0.1 10*3/uL (ref 0–0.1)
EOS ABS: 0.4 10*3/uL (ref 0–0.7)
Eosinophils Relative: 6 %
HCT: 36.6 % (ref 35.0–47.0)
HEMOGLOBIN: 12.8 g/dL (ref 12.0–16.0)
Lymphocytes Relative: 35 %
Lymphs Abs: 2 10*3/uL (ref 1.0–3.6)
MCH: 30.1 pg (ref 26.0–34.0)
MCHC: 35.1 g/dL (ref 32.0–36.0)
MCV: 85.8 fL (ref 80.0–100.0)
MONOS PCT: 9 %
Monocytes Absolute: 0.5 10*3/uL (ref 0.2–0.9)
NEUTROS PCT: 49 %
Neutro Abs: 2.7 10*3/uL (ref 1.4–6.5)
Platelets: 168 10*3/uL (ref 150–440)
RBC: 4.26 MIL/uL (ref 3.80–5.20)
RDW: 14.4 % (ref 11.5–14.5)
WBC: 5.7 10*3/uL (ref 3.6–11.0)

## 2017-05-06 LAB — COMPREHENSIVE METABOLIC PANEL
ALBUMIN: 4.1 g/dL (ref 3.5–5.0)
ALT: 18 U/L (ref 14–54)
ANION GAP: 10 (ref 5–15)
AST: 35 U/L (ref 15–41)
Alkaline Phosphatase: 60 U/L (ref 38–126)
BUN: 22 mg/dL — ABNORMAL HIGH (ref 6–20)
CHLORIDE: 103 mmol/L (ref 101–111)
CO2: 24 mmol/L (ref 22–32)
Calcium: 9.7 mg/dL (ref 8.9–10.3)
Creatinine, Ser: 0.88 mg/dL (ref 0.44–1.00)
GFR calc Af Amer: >60 mL/min (ref >60)
GFR calc non Af Amer: >60 mL/min (ref >60)
GLUCOSE: 96 mg/dL (ref 65–99)
POTASSIUM: 4.3 mmol/L (ref 3.5–5.1)
SODIUM: 137 mmol/L (ref 135–145)
Total Bilirubin: 0.6 mg/dL (ref 0.3–1.2)
Total Protein: 7.8 g/dL (ref 6.5–8.1)

## 2017-05-06 MED ORDER — ANASTROZOLE 1 MG PO TABS: 1.0000 mg | ORAL_TABLET | Freq: Every day | ORAL | 3 refills | Status: DC

## 2017-05-06 NOTE — Assessment & Plan Note (Addendum)
#   Stage I lobular cancer LEFT BREAST - currently on adjuvant arimidex. Tolerating it well. Clinically no recurrence of recurrence. mammo feb 2018-N.   # Chronic mild thrombocytopenia- 126; transient- improved- today 168.  # Bone density 2017-osteoporosis on Fosamax. Recommend again ca+ vit  D BID  # Patient will follow-up with me in 6 months with labs; will order mammo today.

## 2017-05-06 NOTE — Progress Notes (Signed)
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 versus other etiology  # Jan 2017- BMD- OSTEOPOROSIS on fosomax [pcp]  # psoriatic arthritis on Humira [july 2017; Dr.Kernodle]     Carcinoma of overlapping sites of left breast in female, estrogen receptor positive [facility])     Oncology History   # FEB 2016- LOBULAR CA [pT1b sn] ER positive (>90%,), PR positive (>90%), HER2neu negative [2+ on IHC but FISH negative and Her2/CEP17 ratio 1.15]; no Chemo; s/p RT; June 2016  Started Anastrazole  # Chronic mild 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    Denies any hot flashes. Patient is chronic joint pain from arthritis-not any worse. Patient denies any new lumps or bumps. Otherwise no chest pain or cough or shortness of breath.   REVIEW OF SYSTEMS:  A complete 10 point review of system is done which is negative except mentioned above/history of present illness.   PAST MEDICAL HISTORY :  Past Medical History:  Diagnosis Date  . Arthritis   . Breast cancer ([place]) 09/2014   LT LUMPECTOMY  . Osteoporosis   . Personal history of radiation therapy 2016   BREAST CA  . Psoriasis     PAST SURGICAL HISTORY :    Past Surgical History:  Procedure Laterality Date  . BREAST BIOPSY Left 09/28/14   positive    FAMILY HISTORY :   Family History  Problem Relation Age of Onset  . Diabetes Mother   . Lung cancer Father   . Diabetes Sister   . Cancer Brother        went to brain but no one knew the primary  . Breast cancer Neg Hx     SOCIAL HISTORY:   Social History  Substance Use Topics  . Smoking status: Never Smoker  . Smokeless tobacco: Never Used  . Alcohol use No    ALLERGIES:  has No Known Allergies.  MEDICATIONS:  Current Outpatient Prescriptions  Medication Sig Dispense Refill  . Adalimumab (HUMIRA) 40 MG/0.8ML PSKT Inject into the skin every 14 (fourteen) days.    Marland Kitchen alendronate (FOSAMAX) 70 MG tablet Take 70 mg by mouth once a week. Take with a full glass of water on an empty stomach.    Marland Kitchen anastrozole (ARIMIDEX) 1 MG tablet Take 1 tablet (1 mg total) by mouth daily. 90 tablet 3  . Calcium Carbonate-Vitamin D 600-400 MG-UNIT tablet Take by mouth.    . diclofenac sodium (VOLTAREN) 1 % GEL Apply topically.    . fluticasone (FLONASE) 50 MCG/ACT nasal spray as needed.      No current facility-administered medications for this visit.     PHYSICAL EXAMINATION: ECOG PERFORMANCE STATUS: 0 - Asymptomatic  BP (!) 148/77 (BP Location: Left Arm, Patient  Position: Sitting)   Pulse 84   Temp (!) 97.1 F (36.2 C) (Tympanic)   Resp 16   Wt 135 lb 6.4 oz (61.4 kg)   PF (!) 16 L/min   BMI 24.76 kg/m   Filed Weights   05/06/17 1339  Weight: 135 lb 6.4 oz (61.4 kg)    GENERAL: Well-nourished well-developed; Alert, no distress and comfortable.   Alone.  EYES: no pallor or icterus OROPHARYNX: no thrush or ulceration; good dentition  NECK: supple, no masses felt LYMPH:  no palpable lymphadenopathy in the cervical, axillary or inguinal regions LUNGS: clear to auscultation and  No wheeze or crackles HEART/CVS: regular rate & rhythm and no murmurs; No lower extremity  edema ABDOMEN:abdomen soft, non-tender and normal bowel sounds Musculoskeletal:no cyanosis of digits and no clubbing  PSYCH: alert & oriented x 3 with fluent speech NEURO: no focal motor/sensory deficits SKIN:  no rashes or significant lesions  Right and left BREAST exam [in the presence of nurse]- no unusual skin changes or dominant masses felt. Surgical scars noted.    LABORATORY DATA:  I have reviewed the data as listed    Component Value Date/Time   NA 137 05/06/2017 1314   NA 139 11/09/2014 1257   K 4.3 05/06/2017 1314   K 4.7 11/09/2014 1257   CL 103 05/06/2017 1314   CL 105 11/09/2014 1257   CO2 24 05/06/2017 1314   CO2 27 11/09/2014 1257   GLUCOSE 96 05/06/2017 1314   GLUCOSE 110 (H) 11/09/2014 1257   BUN 22 (H) 05/06/2017 1314   BUN 15 11/09/2014 1257   CREATININE 0.88 05/06/2017 1314   CREATININE 0.85 11/09/2014 1257   CALCIUM 9.7 05/06/2017 1314   CALCIUM 9.9 11/09/2014 1257   PROT 7.8 05/06/2017 1314   ALBUMIN 4.1 05/06/2017 1314   AST 35 05/06/2017 1314   ALT 18 05/06/2017 1314   ALKPHOS 60 05/06/2017 1314   BILITOT 0.6 05/06/2017 1314   GFRNONAA >60 05/06/2017 1314   GFRNONAA >60 11/09/2014 1257   GFRAA >60 05/06/2017 1314   GFRAA >60 11/09/2014 1257    No results found for: SPEP, UPEP  Lab Results  Component Value Date   WBC 5.7 05/06/2017   NEUTROABS 2.7 05/06/2017   HGB 12.8 05/06/2017   HCT 36.6 05/06/2017   MCV 85.8 05/06/2017   PLT 168 05/06/2017      Chemistry      Component Value Date/Time   NA 137 05/06/2017 1314   NA 139 11/09/2014 1257   K 4.3 05/06/2017 1314   K 4.7 11/09/2014 1257   CL 103 05/06/2017 1314   CL 105 11/09/2014 1257   CO2 24 05/06/2017 1314   CO2 27 11/09/2014 1257   BUN 22 (H) 05/06/2017 1314   BUN 15 11/09/2014 1257   CREATININE 0.88 05/06/2017 1314   CREATININE 0.85 11/09/2014 1257      Component Value Date/Time   CALCIUM 9.7 05/06/2017 1314   CALCIUM 9.9 11/09/2014 1257   ALKPHOS 60 05/06/2017 1314    AST 35 05/06/2017 1314   ALT 18 05/06/2017 1314   BILITOT 0.6 05/06/2017 1314        ASSESSMENT & PLAN:   Carcinoma of overlapping sites of left breast in female, estrogen receptor positive ([place]) # Stage I lobular cancer LEFT BREAST - currently on adjuvant arimidex. Tolerating it well. Clinically no recurrence of recurrence. mammo feb 2018-N.   # Chronic mild thrombocytopenia- 126; transient- improved- today 168.  # Bone density 2017-osteoporosis  on Fosamax. Recommend again ca+ vit  D BID  # Patient will follow-up with me in 6 months with labs; will order mammo today.       Cammie Sickle, MD 05/15/2017 8:17 PM

## 2017-05-19 DIAGNOSIS — M810 Age-related osteoporosis without current pathological fracture: Secondary | ICD-10-CM | POA: Diagnosis not present

## 2017-05-25 DIAGNOSIS — Z23 Encounter for immunization: Secondary | ICD-10-CM | POA: Diagnosis not present

## 2017-05-25 DIAGNOSIS — Z0000 Encounter for general adult medical examination without abnormal findings: Secondary | ICD-10-CM | POA: Diagnosis not present

## 2017-05-25 DIAGNOSIS — E559 Vitamin D deficiency, unspecified: Secondary | ICD-10-CM | POA: Diagnosis not present

## 2017-05-25 DIAGNOSIS — R7303 Prediabetes: Secondary | ICD-10-CM | POA: Diagnosis not present

## 2017-05-25 DIAGNOSIS — D696 Thrombocytopenia, unspecified: Secondary | ICD-10-CM | POA: Diagnosis not present

## 2017-05-25 DIAGNOSIS — M810 Age-related osteoporosis without current pathological fracture: Secondary | ICD-10-CM | POA: Diagnosis not present

## 2017-07-20 DIAGNOSIS — L400 Psoriasis vulgaris: Secondary | ICD-10-CM | POA: Diagnosis not present

## 2017-08-04 DIAGNOSIS — M810 Age-related osteoporosis without current pathological fracture: Secondary | ICD-10-CM | POA: Diagnosis not present

## 2017-08-04 DIAGNOSIS — L4050 Arthropathic psoriasis, unspecified: Secondary | ICD-10-CM | POA: Diagnosis not present

## 2017-08-04 DIAGNOSIS — G8929 Other chronic pain: Secondary | ICD-10-CM | POA: Diagnosis not present

## 2017-08-04 DIAGNOSIS — M25562 Pain in left knee: Secondary | ICD-10-CM | POA: Diagnosis not present

## 2017-08-04 DIAGNOSIS — M150 Primary generalized (osteo)arthritis: Secondary | ICD-10-CM | POA: Diagnosis not present

## 2017-09-09 ENCOUNTER — Ambulatory Visit: Payer: PPO | Attending: Radiation Oncology | Admitting: Radiation Oncology

## 2017-10-02 ENCOUNTER — Ambulatory Visit
Admission: RE | Admit: 2017-10-02 | Discharge: 2017-10-02 | Disposition: A | Discharge status: Home / Self Care | Payer: PPO | Source: Ambulatory Visit | Attending: Internal Medicine | Admitting: Internal Medicine

## 2017-10-02 DIAGNOSIS — C50812 Malignant neoplasm of overlapping sites of left female breast: Secondary | ICD-10-CM | POA: Diagnosis not present

## 2017-10-02 DIAGNOSIS — Z170 Estrogen receptor positive status [ER+]: Secondary | ICD-10-CM | POA: Diagnosis not present

## 2017-10-02 DIAGNOSIS — Z9889 Other specified postprocedural states: Secondary | ICD-10-CM | POA: Diagnosis not present

## 2017-10-02 DIAGNOSIS — R922 Inconclusive mammogram: Secondary | ICD-10-CM | POA: Diagnosis not present

## 2017-11-02 ENCOUNTER — Inpatient Hospital Stay: Payer: PPO | Attending: Internal Medicine

## 2017-11-02 ENCOUNTER — Inpatient Hospital Stay (HOSPITAL_BASED_OUTPATIENT_CLINIC_OR_DEPARTMENT_OTHER): Payer: PPO | Admitting: Internal Medicine

## 2017-11-02 VITALS — BP 163/79 | HR 75 | Temp 97.9°F | Resp 16

## 2017-11-02 DIAGNOSIS — Z170 Estrogen receptor positive status [ER+]: Secondary | ICD-10-CM

## 2017-11-02 DIAGNOSIS — M810 Age-related osteoporosis without current pathological fracture: Secondary | ICD-10-CM | POA: Insufficient documentation

## 2017-11-02 DIAGNOSIS — C50812 Malignant neoplasm of overlapping sites of left female breast: Secondary | ICD-10-CM | POA: Diagnosis not present

## 2017-11-02 DIAGNOSIS — Z79811 Long term (current) use of aromatase inhibitors: Secondary | ICD-10-CM | POA: Diagnosis not present

## 2017-11-02 DIAGNOSIS — Z79899 Other long term (current) drug therapy: Secondary | ICD-10-CM

## 2017-11-02 DIAGNOSIS — Z923 Personal history of irradiation: Secondary | ICD-10-CM

## 2017-11-02 DIAGNOSIS — L4050 Arthropathic psoriasis, unspecified: Secondary | ICD-10-CM | POA: Diagnosis not present

## 2017-11-02 LAB — BASIC METABOLIC PANEL
Anion gap: 10 (ref 5–15)
BUN: 23 mg/dL — AB (ref 6–20)
CO2: 23 mmol/L (ref 22–32)
CREATININE: 0.89 mg/dL (ref 0.44–1.00)
Calcium: 9.3 mg/dL (ref 8.9–10.3)
Chloride: 104 mmol/L (ref 101–111)
GFR calc Af Amer: >60 mL/min (ref >60)
GLUCOSE: 107 mg/dL — AB (ref 65–99)
Potassium: 4.4 mmol/L (ref 3.5–5.1)
Sodium: 137 mmol/L (ref 135–145)

## 2017-11-02 LAB — CBC WITH DIFFERENTIAL/PLATELET
BASOS ABS: 0 10*3/uL (ref 0–0.1)
Basophils Relative: 1 %
EOS ABS: 0.3 10*3/uL (ref 0–0.7)
EOS PCT: 7 %
HCT: 39.1 % (ref 35.0–47.0)
Hemoglobin: 13.4 g/dL (ref 12.0–16.0)
Lymphocytes Relative: 35 %
Lymphs Abs: 1.7 10*3/uL (ref 1.0–3.6)
MCH: 29.8 pg (ref 26.0–34.0)
MCHC: 34.4 g/dL (ref 32.0–36.0)
MCV: 86.8 fL (ref 80.0–100.0)
MONO ABS: 0.4 10*3/uL (ref 0.2–0.9)
Monocytes Relative: 9 %
Neutro Abs: 2.3 10*3/uL (ref 1.4–6.5)
Neutrophils Relative %: 48 %
PLATELETS: 150 10*3/uL (ref 150–440)
RBC: 4.5 MIL/uL (ref 3.80–5.20)
RDW: 14.7 % — AB (ref 11.5–14.5)
WBC: 4.8 10*3/uL (ref 3.6–11.0)

## 2017-11-02 NOTE — Assessment & Plan Note (Addendum)
#   Stage I lobular cancer LEFT BREAST - currently on adjuvant arimidex. Tolerating it well. Clinically no recurrence of recurrence. mammo feb 2019-N. Will stop arimidex -  In summer of 2021.   # Bone density aug 2018- -osteoporosis on Fosamax. Recommend again ca+ vit  D BID; stable.   # follow up in 37months/labs/ PCP- mammo.

## 2017-11-02 NOTE — Progress Notes (Signed)
[place] OFFICE PROGRESS NOTE  Patient Care Team: Glendon Axe, MD as PCP - General (Internal Medicine); Dr.Wilton Tamala Julian   SUMMARY OF ONCOLOGIC HISTORY:  Oncology History   # FEB 2016- LOBULAR CA [pT1b sn] ER positive (>90%,), PR positive (>90%), HER2neu negative [2+ on IHC but FISH negative and Her2/CEP17 ratio 1.15]; no Chemo; s/p RT; June 2016  Started Anastrazole  # Jan 2017- BMD- OSTEOPOROSIS on fosomax [pcp]  # psoriatic arthritis on Humira [july 2017; Dr.Kernodle]     Carcinoma of overlapping sites of left breast in female, estrogen receptor positive [facility])     Oncology History   # FEB 2016- LOBULAR CA [pT1b sn] ER positive (>90%,), PR positive (>90%), HER2neu negative [2+ on IHC but FISH negative and Her2/CEP17 ratio 1.15]; no Chemo; s/p RT; June 2016  Started Anastrazole  # Jan 2017- BMD- OSTEOPOROSIS on fosomax [pcp]  # psoriatic arthritis on Humira [july 2017; Dr.Kernodle]     Carcinoma of overlapping sites of left breast in female, estrogen receptor positive ([place])     INTERVAL HISTORY:  A pleasant 77 year old female patient with above history of lobular cancer stage I currently on adjuvant Arimidex is here for follow-up.    Patient denies any worsening joint pains.  She has mild chronic arthritis. Patient denies any new lumps or bumps. Otherwise no chest pain or cough or shortness of breath.   REVIEW OF SYSTEMS:  A complete 10 point review of system is done which is negative except mentioned above/history of present illness.   PAST MEDICAL HISTORY :  Past Medical History:  Diagnosis Date  . Arthritis   . Breast cancer ([place]) 09/2014   LT LUMPECTOMY  . Osteoporosis   . Personal history of radiation therapy 2016   BREAST CA  . Psoriasis     PAST SURGICAL HISTORY :   Past Surgical History:  Procedure Laterality Date  . BREAST BIOPSY Left 09/28/14   positive  . BREAST LUMPECTOMY Left 2016   INVASIVE LOBULAR CARCINOMA OF BREAST,  [place] GRADE 2    FAMILY HISTORY :   Family History  Problem Relation Age of Onset  . Diabetes Mother   . Lung cancer Father   . Diabetes Sister   . Cancer Brother        went to brain but no one knew the primary  . Breast cancer Neg Hx     SOCIAL HISTORY:   Social History   Tobacco Use  . Smoking status: Never Smoker  . Smokeless tobacco: Never Used  Substance Use Topics  . Alcohol use: No  . Drug use: No    ALLERGIES:  has No Known Allergies.  MEDICATIONS:  Current Outpatient Medications  Medication Sig Dispense Refill  . Adalimumab (HUMIRA) 40 MG/0.8ML PSKT Inject into the skin every 14 (fourteen) days.    Marland Kitchen alendronate (FOSAMAX) 70 MG tablet Take 70 mg by mouth once a week. Take with a full glass of water on an empty stomach.    Marland Kitchen anastrozole (ARIMIDEX) 1 MG tablet Take 1 tablet (1 mg total) by mouth daily. 90 tablet 3  . Calcium Carbonate-Vitamin D 600-400 MG-UNIT tablet Take by mouth.    . fluticasone (FLONASE) 50 MCG/ACT nasal spray as needed.      No current facility-administered medications for this visit.     PHYSICAL EXAMINATION: ECOG PERFORMANCE STATUS: 0 - Asymptomatic  BP (!) 163/79 (BP Location: Left Arm, Patient Position: Sitting)   Pulse 75   Temp 97.9  F (36.6 C) (Tympanic)   Resp 16   There were no vitals filed for this visit.  GENERAL: Well-nourished well-developed; Alert, no distress and comfortable.   Alone.  EYES: no pallor or icterus OROPHARYNX: no thrush or ulceration; good dentition  NECK: supple, no masses felt LYMPH:  no palpable lymphadenopathy in the cervical, axillary or inguinal regions LUNGS: clear to auscultation and  No wheeze or crackles HEART/CVS: regular rate & rhythm and no murmurs; No lower extremity edema ABDOMEN:abdomen soft, non-tender and normal bowel sounds Musculoskeletal:no cyanosis of digits and no clubbing  PSYCH: alert & oriented x 3 with fluent speech NEURO: no focal motor/sensory deficits SKIN:  no  rashes or significant lesions  Right and left BREAST exam [in the presence of nurse]- no unusual skin changes or dominant masses felt. Surgical scars noted.    LABORATORY DATA:  I have reviewed the data as listed    Component Value Date/Time   NA 137 11/02/2017 1115   NA 139 11/09/2014 1257   K 4.4 11/02/2017 1115   K 4.7 11/09/2014 1257   CL 104 11/02/2017 1115   CL 105 11/09/2014 1257   CO2 23 11/02/2017 1115   CO2 27 11/09/2014 1257   GLUCOSE 107 (H) 11/02/2017 1115   GLUCOSE 110 (H) 11/09/2014 1257   BUN 23 (H) 11/02/2017 1115   BUN 15 11/09/2014 1257   CREATININE 0.89 11/02/2017 1115   CREATININE 0.85 11/09/2014 1257   CALCIUM 9.3 11/02/2017 1115   CALCIUM 9.9 11/09/2014 1257   PROT 7.8 05/06/2017 1314   ALBUMIN 4.1 05/06/2017 1314   AST 35 05/06/2017 1314   ALT 18 05/06/2017 1314   ALKPHOS 60 05/06/2017 1314   BILITOT 0.6 05/06/2017 1314   GFRNONAA >60 11/02/2017 1115   GFRNONAA >60 11/09/2014 1257   GFRAA >60 11/02/2017 1115   GFRAA >60 11/09/2014 1257    No results found for: SPEP, UPEP  Lab Results  Component Value Date   WBC 4.8 11/02/2017   NEUTROABS 2.3 11/02/2017   HGB 13.4 11/02/2017   HCT 39.1 11/02/2017   MCV 86.8 11/02/2017   PLT 150 11/02/2017      Chemistry      Component Value Date/Time   NA 137 11/02/2017 1115   NA 139 11/09/2014 1257   K 4.4 11/02/2017 1115   K 4.7 11/09/2014 1257   CL 104 11/02/2017 1115   CL 105 11/09/2014 1257   CO2 23 11/02/2017 1115   CO2 27 11/09/2014 1257   BUN 23 (H) 11/02/2017 1115   BUN 15 11/09/2014 1257   CREATININE 0.89 11/02/2017 1115   CREATININE 0.85 11/09/2014 1257      Component Value Date/Time   CALCIUM 9.3 11/02/2017 1115   CALCIUM 9.9 11/09/2014 1257   ALKPHOS 60 05/06/2017 1314   AST 35 05/06/2017 1314   ALT 18 05/06/2017 1314   BILITOT 0.6 05/06/2017 1314        ASSESSMENT & PLAN:   Carcinoma of overlapping sites of left breast in female, estrogen receptor positive ([place]) #  Stage I lobular cancer LEFT BREAST - currently on adjuvant arimidex. Tolerating it well. Clinically no recurrence of recurrence. mammo feb 2019-N. Will stop arimidex -  In summer of 2021.   # Bone density aug 2018- -osteoporosis on Fosamax. Recommend again ca+ vit  D BID; stable.   # follow up in 57month/labs/ PCP- mammo.       GCammie Sickle MD 11/02/2017 6:17 PM

## 2017-11-04 ENCOUNTER — Other Ambulatory Visit: Payer: PPO

## 2017-11-04 ENCOUNTER — Ambulatory Visit: Payer: PPO | Admitting: Internal Medicine

## 2017-11-05 ENCOUNTER — Ambulatory Visit: Payer: PPO | Admitting: Internal Medicine

## 2017-11-05 ENCOUNTER — Other Ambulatory Visit: Payer: PPO

## 2017-11-20 DIAGNOSIS — Z85038 Personal history of other malignant neoplasm of large intestine: Secondary | ICD-10-CM | POA: Diagnosis not present

## 2018-01-18 DIAGNOSIS — B078 Other viral warts: Secondary | ICD-10-CM | POA: Diagnosis not present

## 2018-01-18 DIAGNOSIS — L570 Actinic keratosis: Secondary | ICD-10-CM | POA: Diagnosis not present

## 2018-01-18 DIAGNOSIS — L400 Psoriasis vulgaris: Secondary | ICD-10-CM | POA: Diagnosis not present

## 2018-01-18 DIAGNOSIS — L820 Inflamed seborrheic keratosis: Secondary | ICD-10-CM | POA: Diagnosis not present

## 2018-01-18 DIAGNOSIS — Z79899 Other long term (current) drug therapy: Secondary | ICD-10-CM | POA: Diagnosis not present

## 2018-01-18 DIAGNOSIS — L538 Other specified erythematous conditions: Secondary | ICD-10-CM | POA: Diagnosis not present

## 2018-01-18 DIAGNOSIS — X32XXXA Exposure to sunlight, initial encounter: Secondary | ICD-10-CM | POA: Diagnosis not present

## 2018-01-29 ENCOUNTER — Other Ambulatory Visit: Payer: Self-pay | Admitting: *Deleted

## 2018-01-29 DIAGNOSIS — C50812 Malignant neoplasm of overlapping sites of left female breast: Secondary | ICD-10-CM

## 2018-01-29 DIAGNOSIS — Z170 Estrogen receptor positive status [ER+]: Principal | ICD-10-CM

## 2018-01-29 MED ORDER — ANASTROZOLE 1 MG PO TABS: 1.0000 mg | ORAL_TABLET | Freq: Every day | ORAL | 3 refills | Status: DC

## 2018-02-02 DIAGNOSIS — L4050 Arthropathic psoriasis, unspecified: Secondary | ICD-10-CM | POA: Diagnosis not present

## 2018-02-02 DIAGNOSIS — G8929 Other chronic pain: Secondary | ICD-10-CM | POA: Diagnosis not present

## 2018-02-02 DIAGNOSIS — M150 Primary generalized (osteo)arthritis: Secondary | ICD-10-CM | POA: Diagnosis not present

## 2018-02-02 DIAGNOSIS — L409 Psoriasis, unspecified: Secondary | ICD-10-CM | POA: Diagnosis not present

## 2018-02-02 DIAGNOSIS — M25562 Pain in left knee: Secondary | ICD-10-CM | POA: Diagnosis not present

## 2018-03-29 ENCOUNTER — Encounter: Payer: Self-pay | Admitting: *Deleted

## 2018-03-30 ENCOUNTER — Ambulatory Visit
Admission: RE | Admit: 2018-03-30 | Discharge: 2018-03-30 | Disposition: A | Discharge status: Home / Self Care | Payer: PPO | Source: Ambulatory Visit | Attending: Internal Medicine | Admitting: Internal Medicine

## 2018-03-30 ENCOUNTER — Encounter: Payer: Self-pay | Admitting: *Deleted

## 2018-03-30 ENCOUNTER — Ambulatory Visit: Payer: PPO | Admitting: Registered Nurse

## 2018-03-30 ENCOUNTER — Encounter
Admission: RE | Disposition: A | Discharge status: Home / Self Care | Payer: Self-pay | Source: Ambulatory Visit | Attending: Internal Medicine

## 2018-03-30 DIAGNOSIS — Z79899 Other long term (current) drug therapy: Secondary | ICD-10-CM | POA: Diagnosis not present

## 2018-03-30 DIAGNOSIS — Z853 Personal history of malignant neoplasm of breast: Secondary | ICD-10-CM | POA: Insufficient documentation

## 2018-03-30 DIAGNOSIS — K648 Other hemorrhoids: Secondary | ICD-10-CM | POA: Diagnosis not present

## 2018-03-30 DIAGNOSIS — K5520 Angiodysplasia of colon without hemorrhage: Secondary | ICD-10-CM | POA: Diagnosis not present

## 2018-03-30 DIAGNOSIS — Z923 Personal history of irradiation: Secondary | ICD-10-CM | POA: Diagnosis not present

## 2018-03-30 DIAGNOSIS — Z1211 Encounter for screening for malignant neoplasm of colon: Secondary | ICD-10-CM | POA: Diagnosis not present

## 2018-03-30 DIAGNOSIS — K640 First degree hemorrhoids: Secondary | ICD-10-CM | POA: Diagnosis not present

## 2018-03-30 DIAGNOSIS — Z7983 Long term (current) use of bisphosphonates: Secondary | ICD-10-CM | POA: Diagnosis not present

## 2018-03-30 DIAGNOSIS — M810 Age-related osteoporosis without current pathological fracture: Secondary | ICD-10-CM | POA: Diagnosis not present

## 2018-03-30 DIAGNOSIS — Z85038 Personal history of other malignant neoplasm of large intestine: Secondary | ICD-10-CM | POA: Insufficient documentation

## 2018-03-30 DIAGNOSIS — Z08 Encounter for follow-up examination after completed treatment for malignant neoplasm: Secondary | ICD-10-CM | POA: Diagnosis not present

## 2018-03-30 DIAGNOSIS — K5730 Diverticulosis of large intestine without perforation or abscess without bleeding: Secondary | ICD-10-CM | POA: Diagnosis not present

## 2018-03-30 DIAGNOSIS — Z86010 Personal history of colonic polyps: Secondary | ICD-10-CM | POA: Diagnosis not present

## 2018-03-30 DIAGNOSIS — K5790 Diverticulosis of intestine, part unspecified, without perforation or abscess without bleeding: Secondary | ICD-10-CM | POA: Diagnosis not present

## 2018-03-30 DIAGNOSIS — Z9049 Acquired absence of other specified parts of digestive tract: Secondary | ICD-10-CM | POA: Diagnosis not present

## 2018-03-30 HISTORY — PX: COLONOSCOPY WITH PROPOFOL: SHX5780

## 2018-03-30 HISTORY — DX: Personal history of other diseases of the digestive system: Z87.19

## 2018-03-30 HISTORY — DX: Personal history of other infectious and parasitic diseases: Z86.19

## 2018-03-30 SURGERY — COLONOSCOPY WITH PROPOFOL
Anesthesia: General

## 2018-03-30 MED ORDER — PROPOFOL 500 MG/50ML IV EMUL
INTRAVENOUS | Status: DC | PRN
  Administered 2018-03-30: 140 ug/kg/min via INTRAVENOUS

## 2018-03-30 MED ORDER — LIDOCAINE HCL (CARDIAC) PF 100 MG/5ML IV SOSY
PREFILLED_SYRINGE | INTRAVENOUS | Status: DC | PRN
  Administered 2018-03-30: 40 mg via INTRAVENOUS

## 2018-03-30 MED ORDER — SODIUM CHLORIDE 0.9 % IV SOLN
INTRAVENOUS | Status: DC
  Administered 2018-03-30: 1000 mL via INTRAVENOUS

## 2018-03-30 MED ORDER — PROPOFOL 10 MG/ML IV BOLUS
INTRAVENOUS | Status: DC | PRN
  Administered 2018-03-30: 60 mg via INTRAVENOUS

## 2018-03-30 NOTE — Anesthesia Preprocedure Evaluation (Signed)
Anesthesia Evaluation  Patient identified by MRN, date of birth, ID band Patient awake    Reviewed: Allergy & Precautions, H&P , NPO status , Patient's Chart, lab work & pertinent test results, reviewed documented beta blocker date and time   History of Anesthesia Complications Negative for: history of anesthetic complications  Airway Mallampati: II  TM Distance: >3 FB Neck ROM: full    Dental  (+) Dental Advidsory Given, Missing, Teeth Intact   Pulmonary neg pulmonary ROS,           Cardiovascular Exercise Tolerance: Good negative cardio ROS       Neuro/Psych negative neurological ROS  negative psych ROS   GI/Hepatic negative GI ROS, Neg liver ROS,   Endo/Other  negative endocrine ROS  Renal/GU negative Renal ROS  negative genitourinary   Musculoskeletal   Abdominal   Peds  Hematology negative hematology ROS (+)   Anesthesia Other Findings Past Medical History: No date: Arthritis     Comment:  OSTEOARTHRITIS,PSORIATIC ARTHRITIS 09/2014: Breast cancer ([place])     Comment:  LT LUMPECTOMY No date: History of chickenpox No date: History of diverticulitis No date: Osteoporosis 2016: Personal history of radiation therapy     Comment:  BREAST CA No date: Psoriasis   Reproductive/Obstetrics negative OB ROS                             Anesthesia Physical Anesthesia Plan  ASA: II  Anesthesia Plan: General   Post-op Pain Management:    Induction: Intravenous  PONV Risk Score and Plan: 3 and Propofol infusion and TIVA  Airway Management Planned: Nasal Cannula  Additional Equipment:   Intra-op Plan:   Post-operative Plan:   Informed Consent: I have reviewed the patients History and Physical, chart, labs and discussed the procedure including the risks, benefits and alternatives for the proposed anesthesia with the patient or authorized representative who has indicated his/her  understanding and acceptance.   Dental Advisory Given  Plan Discussed with: Anesthesiologist, CRNA and Surgeon  Anesthesia Plan Comments:         Anesthesia Quick Evaluation

## 2018-03-30 NOTE — Anesthesia Post-op Follow-up Note (Signed)
Anesthesia QCDR form completed.        

## 2018-03-30 NOTE — Op Note (Signed)
[facility] Gastroenterology Patient Name: April Mills Procedure Date: 03/30/2018 11:34 AM MRN: [id number] Account #: [id number] Date of Birth: 10-12-1940 Admit Type: Outpatient Age: 77 Room: [facility] ENDO ROOM 2 Gender: Female Note Status: Finalized Procedure:            Colonoscopy Indications:          High risk colon cancer surveillance: Personal history                        of colon cancer Providers:            [name]. Vardaan Depascale MD, MD Medicines:            Propofol per Anesthesia Complications:        No immediate complications. Procedure:            Pre-Anesthesia Assessment:                       - The risks and benefits of the procedure and the                        sedation options and risks were discussed with the                        patient. All questions were answered and informed                        consent was obtained.                       - Patient identification and proposed procedure were                        verified prior to the procedure by the nurse. The                        procedure was verified in the procedure room.                       - ASA Grade Assessment: III - A patient with severe                        systemic disease.                       - After reviewing the risks and benefits, the patient                        was deemed in satisfactory condition to undergo the                        procedure.                       After obtaining informed consent, the colonoscope was                        passed under direct vision. Throughout the procedure,                        the patient's blood pressure, pulse, and oxygen  saturations were monitored continuously. The                        Colonoscope was introduced through the anus and                        advanced to the the cecum, identified by appendiceal                        orifice and ileocecal valve. The colonoscopy was         performed without difficulty. The patient tolerated the                        procedure well. The quality of the bowel preparation                        was good. The ileocecal valve, appendiceal orifice, and                        rectum were photographed. Findings:      The perianal and digital rectal examinations were normal. Pertinent       negatives include normal sphincter tone and no palpable rectal lesions.      Multiple diverticula were found in the sigmoid colon.      Non-bleeding internal hemorrhoids were found during retroflexion. The       hemorrhoids were Grade I (internal hemorrhoids that do not prolapse).      The exam was otherwise without abnormality.      Two small localized angioectasias with typical arborization were found       in the cecum. Impression:           - Diverticulosis in the sigmoid colon.                       - Non-bleeding internal hemorrhoids.                       - The examination was otherwise normal.                       - No specimens collected. Recommendation:       - Patient has a contact number available for                        emergencies. The signs and symptoms of potential                        delayed complications were discussed with the patient.                        Return to normal activities tomorrow. Written discharge                        instructions were provided to the patient.                       - Resume previous diet.                       - Continue present medications.                       -  No repeat colonoscopy due to current age (62 years or                        older) and the absence of advanced adenomas.                       - Return to GI office PRN.                       - The findings and recommendations were discussed with                        the patient and their family. Procedure Code(s):    --- Professional ---                       Z9935, Colorectal cancer screening; colonoscopy on                         individual at high risk Diagnosis Code(s):    --- Professional ---                       K57.30, Diverticulosis of large intestine without                        perforation or abscess without bleeding                       K64.0, First degree hemorrhoids                       Z85.038, Personal history of other malignant neoplasm                        of large intestine CPT copyright 2017 American Medical Association. All rights reserved. The codes documented in this report are preliminary and upon coder review may  be revised to meet current compliance requirements. Efrain Sella MD, MD 03/30/2018 12:00:04 PM This report has been signed electronically. Number of Addenda: 0 Note Initiated On: 03/30/2018 11:34 AM Scope Withdrawal Time: 0 hours 6 minutes 23 seconds  Total Procedure Duration: 0 hours 12 minutes 44 seconds       [facility]

## 2018-03-30 NOTE — H&P (Signed)
Outpatient short stay form Pre-procedure 03/30/2018 11:30 AM April Mills K. [name], M.D.  Primary Physician: Glendon Axe, M.D.  Reason for visit:  Personal hx of colon cancer.  History of present illness:  Patient has a remote hx of colon cancer s/p partial colectomy. Patient denies change in bowel habits, rectal bleeding, weight loss or abdominal pain.     Current Facility-Administered Medications:  .  0.9 %  sodium chloride infusion, , Intravenous, Continuous, [name], [name], MD, Last Rate: 20 mL/hr at 03/30/18 1112, 1,000 mL at 03/30/18 1112  Medications Prior to Admission  Medication Sig Dispense Refill Last Dose  . Adalimumab (HUMIRA) 40 MG/0.8ML PSKT Inject into the skin every 14 (fourteen) days.   Past Week at Unknown time  . alendronate (FOSAMAX) 70 MG tablet Take 70 mg by mouth once a week. Take with a full glass of water on an empty stomach.   Past Week at Unknown time  . anastrozole (ARIMIDEX) 1 MG tablet Take 1 tablet (1 mg total) by mouth daily. 90 tablet 3 03/29/2018 at Unknown time  . Calcium Carbonate-Vitamin D 600-400 MG-UNIT tablet Take by mouth.   Completed Course at Unknown time  . fluticasone (FLONASE) 50 MCG/ACT nasal spray as needed.    Completed Course at Unknown time     No Known Allergies   Past Medical History:  Diagnosis Date  . Arthritis    OSTEOARTHRITIS,PSORIATIC ARTHRITIS  . Breast cancer ([place]) 09/2014   LT LUMPECTOMY  . History of chickenpox   . History of diverticulitis   . Osteoporosis   . Personal history of radiation therapy 2016   BREAST CA  . Psoriasis     Review of systems:  Otherwise negative.    Physical Exam  Gen: Alert, oriented. Appears stated age.  HEENT: [place]/AT. PERRLA. Lungs: CTA, no wheezes. CV: RR nl S1, S2. Abd: soft, benign, no masses. BS+ Ext: No edema. Pulses 2+    Planned procedures: Proceed with colonoscopy. The patient understands the nature of the planned procedure, indications, risks, alternatives and  potential complications including but not limited to bleeding, infection, perforation, damage to internal organs and possible oversedation/side effects from anesthesia. The patient agrees and gives consent to proceed.  Please refer to procedure notes for findings, recommendations and patient disposition/instructions.     Samba Cumba K. [name], M.D. Gastroenterology 03/30/2018  11:30 AM

## 2018-03-30 NOTE — Interval H&P Note (Signed)
History and Physical Interval Note:  03/30/2018 11:31 AM  April Mills  has presented today for surgery, with the diagnosis of Personal hx colon cancer  The various methods of treatment have been discussed with the patient and family. After consideration of risks, benefits and other options for treatment, the patient has consented to  Procedure(s): COLONOSCOPY WITH PROPOFOL (N/A) as a surgical intervention .  The patient's history has been reviewed, patient examined, no change in status, stable for surgery.  I have reviewed the patient's chart and labs.  Questions were answered to the patient's satisfaction.     [place], [place]

## 2018-03-30 NOTE — Transfer of Care (Signed)
Immediate Anesthesia Transfer of Care Note  Patient: April Mills  Procedure(s) Performed: COLONOSCOPY WITH PROPOFOL (N/A )  Patient Location: PACU  Anesthesia Type:General  Level of Consciousness: sedated  Airway & Oxygen Therapy: Patient Spontanous Breathing and Patient connected to nasal cannula oxygen  Post-op Assessment: Report given to RN and Post -op Vital signs reviewed and stable  Post vital signs: Reviewed and stable  Last Vitals:  Vitals Value Taken Time  BP 130/55 03/30/2018 12:02 PM  Temp 36.1 C 03/30/2018 12:02 PM  Pulse 77 03/30/2018 12:02 PM  Resp 17 03/30/2018 12:02 PM  SpO2 98 % 03/30/2018 12:02 PM    Last Pain:  Vitals:   03/30/18 1200  TempSrc: Tympanic  PainSc: 0-No pain         Complications: No apparent anesthesia complications

## 2018-03-31 NOTE — Anesthesia Postprocedure Evaluation (Signed)
Anesthesia Post Note  Patient: Doxie Augenstein Yearby  Procedure(s) Performed: COLONOSCOPY WITH PROPOFOL (N/A )  Patient location during evaluation: Endoscopy Anesthesia Type: General Level of consciousness: awake and alert Pain management: pain level controlled Vital Signs Assessment: post-procedure vital signs reviewed and stable Respiratory status: spontaneous breathing, nonlabored ventilation, respiratory function stable and patient connected to nasal cannula oxygen Cardiovascular status: blood pressure returned to baseline and stable Postop Assessment: no apparent nausea or vomiting Anesthetic complications: no     Last Vitals:  Vitals:   03/30/18 1220 03/30/18 1230  BP: (!) 162/59 (!) 163/64  Pulse: 73 70  Resp: 15 16  Temp:    SpO2: 100% 100%    Last Pain:  Vitals:   03/30/18 1230  TempSrc:   PainSc: 0-No pain                 Martha Clan

## 2018-04-05 ENCOUNTER — Encounter: Payer: Self-pay | Admitting: Internal Medicine

## 2018-05-06 DIAGNOSIS — L4050 Arthropathic psoriasis, unspecified: Secondary | ICD-10-CM | POA: Diagnosis not present

## 2018-05-11 DIAGNOSIS — L409 Psoriasis, unspecified: Secondary | ICD-10-CM | POA: Diagnosis not present

## 2018-05-11 DIAGNOSIS — T50905A Adverse effect of unspecified drugs, medicaments and biological substances, initial encounter: Secondary | ICD-10-CM | POA: Diagnosis not present

## 2018-05-11 DIAGNOSIS — L4050 Arthropathic psoriasis, unspecified: Secondary | ICD-10-CM | POA: Diagnosis not present

## 2018-05-11 DIAGNOSIS — D6959 Other secondary thrombocytopenia: Secondary | ICD-10-CM | POA: Diagnosis not present

## 2018-05-11 DIAGNOSIS — R945 Abnormal results of liver function studies: Secondary | ICD-10-CM | POA: Diagnosis not present

## 2018-06-02 DIAGNOSIS — L4050 Arthropathic psoriasis, unspecified: Secondary | ICD-10-CM | POA: Diagnosis not present

## 2018-06-09 DIAGNOSIS — M25562 Pain in left knee: Secondary | ICD-10-CM | POA: Diagnosis not present

## 2018-06-09 DIAGNOSIS — M150 Primary generalized (osteo)arthritis: Secondary | ICD-10-CM | POA: Diagnosis not present

## 2018-06-09 DIAGNOSIS — L4050 Arthropathic psoriasis, unspecified: Secondary | ICD-10-CM | POA: Diagnosis not present

## 2018-06-09 DIAGNOSIS — G8929 Other chronic pain: Secondary | ICD-10-CM | POA: Diagnosis not present

## 2018-06-09 DIAGNOSIS — T50905A Adverse effect of unspecified drugs, medicaments and biological substances, initial encounter: Secondary | ICD-10-CM | POA: Diagnosis not present

## 2018-06-09 DIAGNOSIS — R945 Abnormal results of liver function studies: Secondary | ICD-10-CM | POA: Diagnosis not present

## 2018-06-09 DIAGNOSIS — Z79899 Other long term (current) drug therapy: Secondary | ICD-10-CM | POA: Diagnosis not present

## 2018-06-09 DIAGNOSIS — D6959 Other secondary thrombocytopenia: Secondary | ICD-10-CM | POA: Diagnosis not present

## 2018-06-09 DIAGNOSIS — L409 Psoriasis, unspecified: Secondary | ICD-10-CM | POA: Diagnosis not present

## 2018-07-01 DIAGNOSIS — Z79899 Other long term (current) drug therapy: Secondary | ICD-10-CM | POA: Diagnosis not present

## 2018-07-01 DIAGNOSIS — D485 Neoplasm of uncertain behavior of skin: Secondary | ICD-10-CM | POA: Diagnosis not present

## 2018-07-01 DIAGNOSIS — L820 Inflamed seborrheic keratosis: Secondary | ICD-10-CM | POA: Diagnosis not present

## 2018-07-01 DIAGNOSIS — L400 Psoriasis vulgaris: Secondary | ICD-10-CM | POA: Diagnosis not present

## 2018-07-30 DIAGNOSIS — R7303 Prediabetes: Secondary | ICD-10-CM | POA: Diagnosis not present

## 2018-07-30 DIAGNOSIS — Z0000 Encounter for general adult medical examination without abnormal findings: Secondary | ICD-10-CM | POA: Diagnosis not present

## 2018-07-30 DIAGNOSIS — E559 Vitamin D deficiency, unspecified: Secondary | ICD-10-CM | POA: Diagnosis not present

## 2018-07-30 DIAGNOSIS — M810 Age-related osteoporosis without current pathological fracture: Secondary | ICD-10-CM | POA: Diagnosis not present

## 2018-07-30 DIAGNOSIS — J029 Acute pharyngitis, unspecified: Secondary | ICD-10-CM | POA: Diagnosis not present

## 2018-07-30 DIAGNOSIS — D696 Thrombocytopenia, unspecified: Secondary | ICD-10-CM | POA: Diagnosis not present

## 2018-08-03 DIAGNOSIS — R7303 Prediabetes: Secondary | ICD-10-CM | POA: Diagnosis not present

## 2018-08-03 DIAGNOSIS — E559 Vitamin D deficiency, unspecified: Secondary | ICD-10-CM | POA: Diagnosis not present

## 2018-08-03 DIAGNOSIS — L4050 Arthropathic psoriasis, unspecified: Secondary | ICD-10-CM | POA: Diagnosis not present

## 2018-08-03 DIAGNOSIS — Z0000 Encounter for general adult medical examination without abnormal findings: Secondary | ICD-10-CM | POA: Diagnosis not present

## 2018-08-03 DIAGNOSIS — Z79899 Other long term (current) drug therapy: Secondary | ICD-10-CM | POA: Diagnosis not present

## 2018-08-09 DIAGNOSIS — T50905A Adverse effect of unspecified drugs, medicaments and biological substances, initial encounter: Secondary | ICD-10-CM | POA: Diagnosis not present

## 2018-08-09 DIAGNOSIS — L409 Psoriasis, unspecified: Secondary | ICD-10-CM | POA: Diagnosis not present

## 2018-08-09 DIAGNOSIS — L4050 Arthropathic psoriasis, unspecified: Secondary | ICD-10-CM | POA: Diagnosis not present

## 2018-08-09 DIAGNOSIS — D6959 Other secondary thrombocytopenia: Secondary | ICD-10-CM | POA: Diagnosis not present

## 2018-08-09 DIAGNOSIS — G8929 Other chronic pain: Secondary | ICD-10-CM | POA: Diagnosis not present

## 2018-08-09 DIAGNOSIS — M150 Primary generalized (osteo)arthritis: Secondary | ICD-10-CM | POA: Diagnosis not present

## 2018-08-09 DIAGNOSIS — M25562 Pain in left knee: Secondary | ICD-10-CM | POA: Diagnosis not present

## 2018-11-01 ENCOUNTER — Other Ambulatory Visit: Payer: Self-pay

## 2018-11-01 DIAGNOSIS — C50812 Malignant neoplasm of overlapping sites of left female breast: Secondary | ICD-10-CM

## 2018-11-01 DIAGNOSIS — Z170 Estrogen receptor positive status [ER+]: Principal | ICD-10-CM

## 2018-11-03 ENCOUNTER — Inpatient Hospital Stay: Payer: Self-pay | Admitting: Internal Medicine

## 2018-11-03 ENCOUNTER — Inpatient Hospital Stay: Payer: Self-pay

## 2019-01-04 ENCOUNTER — Other Ambulatory Visit: Payer: Self-pay | Admitting: Internal Medicine

## 2019-01-04 DIAGNOSIS — C50812 Malignant neoplasm of overlapping sites of left female breast: Secondary | ICD-10-CM

## 2019-01-18 ENCOUNTER — Ambulatory Visit
Admission: RE | Admit: 2019-01-18 | Discharge: 2019-01-18 | Disposition: A | Discharge status: Home / Self Care | Payer: Medicare HMO | Source: Ambulatory Visit | Attending: Internal Medicine | Admitting: Internal Medicine

## 2019-01-18 ENCOUNTER — Other Ambulatory Visit: Payer: Self-pay

## 2019-01-18 DIAGNOSIS — Z170 Estrogen receptor positive status [ER+]: Secondary | ICD-10-CM | POA: Insufficient documentation

## 2019-01-18 DIAGNOSIS — C50812 Malignant neoplasm of overlapping sites of left female breast: Secondary | ICD-10-CM | POA: Insufficient documentation

## 2019-02-01 ENCOUNTER — Other Ambulatory Visit: Payer: Self-pay

## 2019-02-01 ENCOUNTER — Inpatient Hospital Stay (HOSPITAL_BASED_OUTPATIENT_CLINIC_OR_DEPARTMENT_OTHER): Payer: Medicare HMO | Admitting: Internal Medicine

## 2019-02-01 ENCOUNTER — Encounter: Payer: Self-pay | Admitting: Internal Medicine

## 2019-02-01 ENCOUNTER — Inpatient Hospital Stay: Payer: Medicare HMO | Attending: Internal Medicine

## 2019-02-01 VITALS — BP 154/87 | HR 83 | Temp 96.1°F | Resp 20

## 2019-02-01 DIAGNOSIS — C50812 Malignant neoplasm of overlapping sites of left female breast: Secondary | ICD-10-CM | POA: Insufficient documentation

## 2019-02-01 DIAGNOSIS — Z170 Estrogen receptor positive status [ER+]: Secondary | ICD-10-CM

## 2019-02-01 DIAGNOSIS — L4050 Arthropathic psoriasis, unspecified: Secondary | ICD-10-CM | POA: Insufficient documentation

## 2019-02-01 DIAGNOSIS — Z79811 Long term (current) use of aromatase inhibitors: Secondary | ICD-10-CM | POA: Diagnosis not present

## 2019-02-01 DIAGNOSIS — Z7951 Long term (current) use of inhaled steroids: Secondary | ICD-10-CM

## 2019-02-01 DIAGNOSIS — Z79899 Other long term (current) drug therapy: Secondary | ICD-10-CM

## 2019-02-01 LAB — BASIC METABOLIC PANEL
Anion gap: 10 (ref 5–15)
BUN: 21 mg/dL (ref 8–23)
CO2: 24 mmol/L (ref 22–32)
Calcium: 9.3 mg/dL (ref 8.9–10.3)
Chloride: 106 mmol/L (ref 98–111)
Creatinine, Ser: 0.93 mg/dL (ref 0.44–1.00)
GFR calc Af Amer: >60 mL/min (ref >60)
GFR calc non Af Amer: 59 mL/min — ABNORMAL LOW (ref >60)
Glucose, Bld: 100 mg/dL — ABNORMAL HIGH (ref 70–99)
Potassium: 4.2 mmol/L (ref 3.5–5.1)
Sodium: 140 mmol/L (ref 135–145)

## 2019-02-01 LAB — CBC WITH DIFFERENTIAL/PLATELET
Abs Immature Granulocytes: 0.01 10*3/uL (ref 0.00–0.07)
Basophils Absolute: 0 10*3/uL (ref 0.0–0.1)
Basophils Relative: 1 %
Eosinophils Absolute: 0.3 10*3/uL (ref 0.0–0.5)
Eosinophils Relative: 5 %
HCT: 38.9 % (ref 36.0–46.0)
Hemoglobin: 12.9 g/dL (ref 12.0–15.0)
Immature Granulocytes: 0 %
Lymphocytes Relative: 37 %
Lymphs Abs: 2 10*3/uL (ref 0.7–4.0)
MCH: 29.9 pg (ref 26.0–34.0)
MCHC: 33.2 g/dL (ref 30.0–36.0)
MCV: 90 fL (ref 80.0–100.0)
Monocytes Absolute: 0.6 10*3/uL (ref 0.1–1.0)
Monocytes Relative: 10 %
Neutro Abs: 2.6 10*3/uL (ref 1.7–7.7)
Neutrophils Relative %: 47 %
Platelets: 130 10*3/uL — ABNORMAL LOW (ref 150–400)
RBC: 4.32 MIL/uL (ref 3.87–5.11)
RDW: 13 % (ref 11.5–15.5)
WBC: 5.4 10*3/uL (ref 4.0–10.5)
nRBC: 0 % (ref 0.0–0.2)

## 2019-02-01 MED ORDER — ANASTROZOLE 1 MG PO TABS: 1.0000 mg | ORAL_TABLET | Freq: Every day | ORAL | 3 refills | Status: AC

## 2019-02-01 NOTE — Progress Notes (Signed)
[place] OFFICE PROGRESS NOTE  Patient Care Team: Tracie Harrier, MD as PCP - General (Internal Medicine); Dr.Wilton Tamala Julian   SUMMARY OF ONCOLOGIC HISTORY:  Oncology History Overview Note  # FEB 2016- LOBULAR CA [pT1b sn] ER positive (>90%,), PR positive (>90%), HER2neu negative [2+ on IHC but FISH negative and Her2/CEP17 ratio 1.15]; no Chemo; s/p RT; June 2016  Started Anastrazole  # Jan 2017- BMD- OSTEOPOROSIS on fosomax [pcp]  # psoriatic arthritis on Humira [july 2017; Dr.Kernodle]  DIAGNOSIS: Lobular cancer of the breast  STAGE:    1     ;GOALS: Cure  CURRENT/MOST RECENT THERAPY: Arimidex    Carcinoma of overlapping sites of left breast in female, estrogen receptor positive ([place])     Oncology History Overview Note  # FEB 2016- LOBULAR CA [pT1b sn] ER positive (>90%,), PR positive (>90%), HER2neu negative [2+ on IHC but FISH negative and Her2/CEP17 ratio 1.15]; no Chemo; s/p RT; June 2016  Started Anastrazole  # Jan 2017- BMD- OSTEOPOROSIS on fosomax [pcp]  # psoriatic arthritis on Humira [july 2017; Dr.Kernodle]  DIAGNOSIS: Lobular cancer of the breast  STAGE:    1     ;GOALS: Cure  CURRENT/MOST RECENT THERAPY: Arimidex    Carcinoma of overlapping sites of left breast in female, estrogen receptor positive ([place])     INTERVAL HISTORY:  A pleasant 78 year old female patient with above history of lobular cancer stage I currently on adjuvant Arimidex is here for follow-up.    Patient denies any unusual shortness of breath or cough.  Complains of intermittent joint pain/arthritis.  Not any worse.  No lumps or bumps.  Had a recent mammogram that was normal.  Review of Systems  Constitutional: Negative for chills, diaphoresis, fever, malaise/fatigue and weight loss.  HENT: Negative for nosebleeds and sore throat.   Eyes: Negative for double vision.  Respiratory: Negative for cough, hemoptysis, sputum production, shortness of breath and wheezing.    Cardiovascular: Negative for chest pain, palpitations, orthopnea and leg swelling.  Gastrointestinal: Negative for abdominal pain, blood in stool, constipation, diarrhea, heartburn, melena, nausea and vomiting.  Genitourinary: Negative for dysuria, frequency and urgency.  Musculoskeletal: Positive for back pain and joint pain.  Skin: Negative.  Negative for itching and rash.  Neurological: Negative for dizziness, tingling, focal weakness, weakness and headaches.  Endo/Heme/Allergies: Does not bruise/bleed easily.  Psychiatric/Behavioral: Negative for depression. The patient is not nervous/anxious and does not have insomnia.      PAST MEDICAL HISTORY :  Past Medical History:  Diagnosis Date  . Arthritis    OSTEOARTHRITIS,PSORIATIC ARTHRITIS  . Breast cancer ([place]) 09/2014   LT LUMPECTOMY  . History of chickenpox   . History of diverticulitis   . Osteoporosis   . Personal history of radiation therapy 2016   BREAST CA  . Psoriasis     PAST SURGICAL HISTORY :   Past Surgical History:  Procedure Laterality Date  . BREAST BIOPSY Left 09/28/14   positive  . BREAST LUMPECTOMY Left 2016   INVASIVE LOBULAR CARCINOMA OF BREAST, NOTTINGHAM GRADE 2  . COLONOSCOPY    . COLONOSCOPY WITH PROPOFOL N/A 03/30/2018   Procedure: COLONOSCOPY WITH PROPOFOL;  Surgeon: Toledo, [name], MD;  Location: [facility] ENDOSCOPY;  Service: Gastroenterology;  Laterality: N/A;  . FRACTURE SURGERY Left    WRIST FRACTURE REPAIR  . MASTECTOMY Left    PARTIAL WITH LYMPH NODE BIOPSY  . TONSILLECTOMY     WITH ADENOIDECTOMY    FAMILY HISTORY :  Family History  Problem Relation Age of Onset  . Diabetes Mother   . Lung cancer Father   . Diabetes Sister   . Cancer Brother        went to brain but no one knew the primary  . Breast cancer Neg Hx     SOCIAL HISTORY:   Social History   Tobacco Use  . Smoking status: Never Smoker  . Smokeless tobacco: Never Used  Substance Use Topics  . Alcohol use: No  .  Drug use: No    ALLERGIES:  has No Known Allergies.  MEDICATIONS:  Current Outpatient Medications  Medication Sig Dispense Refill  . Adalimumab (HUMIRA) 40 MG/0.8ML PSKT Inject into the skin every 14 (fourteen) days.    Marland Kitchen alendronate (FOSAMAX) 70 MG tablet Take 70 mg by mouth once a week. Take with a full glass of water on an empty stomach.    Marland Kitchen anastrozole (ARIMIDEX) 1 MG tablet Take 1 tablet (1 mg total) by mouth daily. 90 tablet 3  . Calcium Carbonate-Vitamin D 600-400 MG-UNIT tablet Take by mouth.    . fluticasone (FLONASE) 50 MCG/ACT nasal spray as needed.      No current facility-administered medications for this visit.     PHYSICAL EXAMINATION: ECOG PERFORMANCE STATUS: 0 - Asymptomatic  BP (!) 154/87   Pulse 83   Temp (!) 96.1 F (35.6 C) (Tympanic)   Resp 20   There were no vitals filed for this visit.  Lifestyle  Physical activity  . Days per week: Not on file  . Minutes per session: Not on file    Physical Exam  Constitutional: She is oriented to person, place, and time and well-developed, well-nourished, and in no distress.  HENT:  Head: Normocephalic and atraumatic.  Mouth/Throat: Oropharynx is clear and moist. No oropharyngeal exudate.  Eyes: Pupils are equal, round, and reactive to light.  Neck: Normal range of motion. Neck supple.  Cardiovascular: Normal rate and regular rhythm.  Pulmonary/Chest: No respiratory distress. She has no wheezes.  Abdominal: Soft. Bowel sounds are normal. She exhibits no distension and no mass. There is no abdominal tenderness. There is no rebound and no guarding.  Musculoskeletal: Normal range of motion.        General: No tenderness or edema.  Neurological: She is alert and oriented to person, place, and time.  Skin: Skin is warm.  Right and left BREAST exam (in the presence of nurse)- no unusual skin changes or dominant masses felt. Surgical scars noted.   Psychiatric: Affect normal.      LABORATORY DATA:  I have  reviewed the data as listed    Component Value Date/Time   NA 140 02/01/2019 0937   NA 139 11/09/2014 1257   K 4.2 02/01/2019 0937   K 4.7 11/09/2014 1257   CL 106 02/01/2019 0937   CL 105 11/09/2014 1257   CO2 24 02/01/2019 0937   CO2 27 11/09/2014 1257   GLUCOSE 100 (H) 02/01/2019 0937   GLUCOSE 110 (H) 11/09/2014 1257   BUN 21 02/01/2019 0937   BUN 15 11/09/2014 1257   CREATININE 0.93 02/01/2019 0937   CREATININE 0.85 11/09/2014 1257   CALCIUM 9.3 02/01/2019 0937   CALCIUM 9.9 11/09/2014 1257   PROT 7.8 05/06/2017 1314   ALBUMIN 4.1 05/06/2017 1314   AST 35 05/06/2017 1314   ALT 18 05/06/2017 1314   ALKPHOS 60 05/06/2017 1314   BILITOT 0.6 05/06/2017 1314   GFRNONAA 59 (L) 02/01/2019 5809  GFRNONAA >60 11/09/2014 1257   GFRAA >60 02/01/2019 0937   GFRAA >60 11/09/2014 1257    No results found for: SPEP, UPEP  Lab Results  Component Value Date   WBC 5.4 02/01/2019   NEUTROABS 2.6 02/01/2019   HGB 12.9 02/01/2019   HCT 38.9 02/01/2019   MCV 90.0 02/01/2019   PLT 130 (L) 02/01/2019      Chemistry      Component Value Date/Time   NA 140 02/01/2019 0937   NA 139 11/09/2014 1257   K 4.2 02/01/2019 0937   K 4.7 11/09/2014 1257   CL 106 02/01/2019 0937   CL 105 11/09/2014 1257   CO2 24 02/01/2019 0937   CO2 27 11/09/2014 1257   BUN 21 02/01/2019 0937   BUN 15 11/09/2014 1257   CREATININE 0.93 02/01/2019 0937   CREATININE 0.85 11/09/2014 1257      Component Value Date/Time   CALCIUM 9.3 02/01/2019 0937   CALCIUM 9.9 11/09/2014 1257   ALKPHOS 60 05/06/2017 1314   AST 35 05/06/2017 1314   ALT 18 05/06/2017 1314   BILITOT 0.6 05/06/2017 1314        ASSESSMENT & PLAN:   Carcinoma of overlapping sites of left breast in female, estrogen receptor positive ([place]) # Stage I lobular cancer LEFT BREAST - currently on adjuvant arimidex.  Tolerating well stable.   # Clinically no recurrence of recurrence.  Mammogram June 2020 normal.  Will stop Arimidex in  summer 2021.  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  # Bone density aug 2018- -osteoporosis on Fosamax.  Stable.  Continue calcium+ vit D.  Will get bone density prior to next visit.  # follow up in 12 months-MD- Dexa scan prior; labs- cbc/cmp- Dr.B;  mammo [pcp].       Cammie Sickle, MD 02/01/2019 12:27 PM

## 2019-02-01 NOTE — Assessment & Plan Note (Addendum)
#   Stage I lobular cancer LEFT BREAST - currently on adjuvant arimidex.  Tolerating well stable.   # Clinically no recurrence of recurrence.  Mammogram June 2020 normal.  Will stop Arimidex in summer 2021.  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  # Bone density aug 2018- -osteoporosis on Fosamax.  Stable.  Continue calcium+ vit D.  Will get bone density prior to next visit.  # follow up in 12 months-MD- Dexa scan prior; labs- cbc/cmp- Dr.B;  mammo [pcp].

## 2019-09-12 ENCOUNTER — Other Ambulatory Visit: Payer: Self-pay | Admitting: *Deleted

## 2019-09-12 DIAGNOSIS — Z79811 Long term (current) use of aromatase inhibitors: Secondary | ICD-10-CM

## 2019-09-12 DIAGNOSIS — C50812 Malignant neoplasm of overlapping sites of left female breast: Secondary | ICD-10-CM

## 2019-09-13 ENCOUNTER — Telehealth: Payer: Self-pay | Admitting: *Deleted

## 2019-09-13 NOTE — Telephone Encounter (Signed)
Contacted patient. Unable to schedule her bone density test at this time for this summer unless patient signs a release for her bone density films to be sent to [facility]. Last bone density at [facility] clinic. Pt prefers to have Dr. Ginette Pitman order her next bone density scan this summer 2021. She will ask Dr. Ginette Pitman at her next apt. Pt would be due for her bone density in August 2021

## 2020-01-19 ENCOUNTER — Other Ambulatory Visit: Payer: Medicare HMO

## 2020-01-23 ENCOUNTER — Ambulatory Visit
Admission: RE | Admit: 2020-01-23 | Discharge: 2020-01-23 | Disposition: A | Discharge status: Home / Self Care | Payer: Medicare HMO | Source: Ambulatory Visit | Attending: Internal Medicine | Admitting: Internal Medicine

## 2020-01-23 DIAGNOSIS — Z170 Estrogen receptor positive status [ER+]: Secondary | ICD-10-CM

## 2020-01-23 DIAGNOSIS — Z79811 Long term (current) use of aromatase inhibitors: Secondary | ICD-10-CM

## 2020-01-23 DIAGNOSIS — C50812 Malignant neoplasm of overlapping sites of left female breast: Secondary | ICD-10-CM

## 2020-01-25 ENCOUNTER — Ambulatory Visit: Payer: Medicare HMO | Admitting: Internal Medicine

## 2020-01-25 ENCOUNTER — Other Ambulatory Visit: Payer: Medicare HMO

## 2020-01-26 ENCOUNTER — Other Ambulatory Visit: Payer: Medicare HMO

## 2020-01-27 ENCOUNTER — Inpatient Hospital Stay: Payer: Medicare HMO | Attending: Internal Medicine | Admitting: Internal Medicine

## 2020-01-27 ENCOUNTER — Encounter: Payer: Self-pay | Admitting: Internal Medicine

## 2020-01-27 ENCOUNTER — Inpatient Hospital Stay: Payer: Medicare HMO

## 2020-01-27 ENCOUNTER — Other Ambulatory Visit: Payer: Self-pay

## 2020-01-27 DIAGNOSIS — Z801 Family history of malignant neoplasm of trachea, bronchus and lung: Secondary | ICD-10-CM | POA: Diagnosis not present

## 2020-01-27 DIAGNOSIS — L4050 Arthropathic psoriasis, unspecified: Secondary | ICD-10-CM | POA: Diagnosis not present

## 2020-01-27 DIAGNOSIS — Z170 Estrogen receptor positive status [ER+]: Secondary | ICD-10-CM | POA: Diagnosis not present

## 2020-01-27 DIAGNOSIS — M810 Age-related osteoporosis without current pathological fracture: Secondary | ICD-10-CM | POA: Diagnosis not present

## 2020-01-27 DIAGNOSIS — Z79811 Long term (current) use of aromatase inhibitors: Secondary | ICD-10-CM | POA: Insufficient documentation

## 2020-01-27 DIAGNOSIS — N183 Chronic kidney disease, stage 3 unspecified: Secondary | ICD-10-CM | POA: Diagnosis not present

## 2020-01-27 DIAGNOSIS — Z9012 Acquired absence of left breast and nipple: Secondary | ICD-10-CM | POA: Insufficient documentation

## 2020-01-27 DIAGNOSIS — D696 Thrombocytopenia, unspecified: Secondary | ICD-10-CM | POA: Insufficient documentation

## 2020-01-27 DIAGNOSIS — C50812 Malignant neoplasm of overlapping sites of left female breast: Secondary | ICD-10-CM | POA: Diagnosis present

## 2020-01-27 DIAGNOSIS — Z809 Family history of malignant neoplasm, unspecified: Secondary | ICD-10-CM | POA: Insufficient documentation

## 2020-01-27 LAB — COMPREHENSIVE METABOLIC PANEL
ALT: 13 U/L (ref 0–44)
AST: 26 U/L (ref 15–41)
Albumin: 3.7 g/dL (ref 3.5–5.0)
Alkaline Phosphatase: 60 U/L (ref 38–126)
Anion gap: 10 (ref 5–15)
BUN: 16 mg/dL (ref 8–23)
CO2: 26 mmol/L (ref 22–32)
Calcium: 9.1 mg/dL (ref 8.9–10.3)
Chloride: 104 mmol/L (ref 98–111)
Creatinine, Ser: 1.12 mg/dL — ABNORMAL HIGH (ref 0.44–1.00)
GFR calc Af Amer: 54 mL/min — ABNORMAL LOW (ref >60)
GFR calc non Af Amer: 47 mL/min — ABNORMAL LOW (ref >60)
Glucose, Bld: 170 mg/dL — ABNORMAL HIGH (ref 70–99)
Potassium: 4.4 mmol/L (ref 3.5–5.1)
Sodium: 140 mmol/L (ref 135–145)
Total Bilirubin: 0.6 mg/dL (ref 0.3–1.2)
Total Protein: 7.4 g/dL (ref 6.5–8.1)

## 2020-01-27 LAB — CBC WITH DIFFERENTIAL/PLATELET
Abs Immature Granulocytes: 0.01 10*3/uL (ref 0.00–0.07)
Basophils Absolute: 0 10*3/uL (ref 0.0–0.1)
Basophils Relative: 1 %
Eosinophils Absolute: 0.2 10*3/uL (ref 0.0–0.5)
Eosinophils Relative: 4 %
HCT: 38.6 % (ref 36.0–46.0)
Hemoglobin: 13 g/dL (ref 12.0–15.0)
Immature Granulocytes: 0 %
Lymphocytes Relative: 34 %
Lymphs Abs: 1.6 10*3/uL (ref 0.7–4.0)
MCH: 29.8 pg (ref 26.0–34.0)
MCHC: 33.7 g/dL (ref 30.0–36.0)
MCV: 88.5 fL (ref 80.0–100.0)
Monocytes Absolute: 0.4 10*3/uL (ref 0.1–1.0)
Monocytes Relative: 7 %
Neutro Abs: 2.6 10*3/uL (ref 1.7–7.7)
Neutrophils Relative %: 54 %
Platelets: 138 10*3/uL — ABNORMAL LOW (ref 150–400)
RBC: 4.36 MIL/uL (ref 3.87–5.11)
RDW: 12.8 % (ref 11.5–15.5)
WBC: 4.8 10*3/uL (ref 4.0–10.5)
nRBC: 0 % (ref 0.0–0.2)

## 2020-01-27 NOTE — Assessment & Plan Note (Addendum)
#   Stage I lobular cancer LEFT BREAST - currently on adjuvant arimidex.  Stable.    # Clinically no recurrence of recurrence.  Mammogram June 2021 -WNL; will discontinue Arimidex summer 2021.  #Chronic kidney disease-stage III GFR 47; discussed the importance of avoiding NSAIDs like Aleve Motrin and Advil; okay with Tylenol as needed for arthritis.  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  surveillance.  # Bone density aug 2018- -osteoporosis on Fosamax. STABLE;  Continue calcium+ vit D.  Defer to PCP.   # DISPOSITION: Discussed option of follow-up at the end of 5 years.  Patient agrees to follow-up with PCP # follow up as needed-Dr.B

## 2020-01-27 NOTE — Progress Notes (Signed)
[place] OFFICE PROGRESS NOTE  Patient Care Team: Tracie Harrier, MD as PCP - General (Internal Medicine); Dr.Wilton Tamala Julian   SUMMARY OF ONCOLOGIC HISTORY:  Oncology History Overview Note  # FEB 2016- LOBULAR CA [pT1b sn] ER positive (>90%,), PR positive (>90%), HER2neu negative [2+ on IHC but FISH negative and Her2/CEP17 ratio 1.15]; no Chemo; s/p RT; June 2016  Started Anastrazole  # Jan 2017- BMD- OSTEOPOROSIS on fosomax [pcp]  # psoriatic arthritis on Humira [july 2017; Dr.Kernodle]  DIAGNOSIS: Lobular cancer of the breast  STAGE:    1     ;GOALS: Cure  CURRENT/MOST RECENT THERAPY: Arimidex    Carcinoma of overlapping sites of left breast in female, estrogen receptor positive ([place])     Oncology History Overview Note  # FEB 2016- LOBULAR CA [pT1b sn] ER positive (>90%,), PR positive (>90%), HER2neu negative [2+ on IHC but FISH negative and Her2/CEP17 ratio 1.15]; no Chemo; s/p RT; June 2016  Started Anastrazole  # Jan 2017- BMD- OSTEOPOROSIS on fosomax [pcp]  # psoriatic arthritis on Humira [july 2017; Dr.Kernodle]  DIAGNOSIS: Lobular cancer of the breast  STAGE:    1     ;GOALS: Cure  CURRENT/MOST RECENT THERAPY: Arimidex    Carcinoma of overlapping sites of left breast in female, estrogen receptor positive ([place])     INTERVAL HISTORY:  A pleasant 79 year old female patient with above history of lobular cancer stage I currently on adjuvant Arimidex is here for follow-up.    No new lumps or bumps.  Appetite is good.  No weight loss.  No cough.  Chronic mild joint pains not any worse   Review of Systems  Constitutional: Negative for chills, diaphoresis, fever, malaise/fatigue and weight loss.  HENT: Negative for nosebleeds and sore throat.   Eyes: Negative for double vision.  Respiratory: Negative for cough, hemoptysis, sputum production, shortness of breath and wheezing.   Cardiovascular: Negative for chest pain, palpitations, orthopnea  and leg swelling.  Gastrointestinal: Negative for abdominal pain, blood in stool, constipation, diarrhea, heartburn, melena, nausea and vomiting.  Genitourinary: Negative for dysuria, frequency and urgency.  Musculoskeletal: Positive for back pain and joint pain.  Skin: Negative.  Negative for itching and rash.  Neurological: Negative for dizziness, tingling, focal weakness, weakness and headaches.  Endo/Heme/Allergies: Does not bruise/bleed easily.  Psychiatric/Behavioral: Negative for depression. The patient is not nervous/anxious and does not have insomnia.      PAST MEDICAL HISTORY :  Past Medical History:  Diagnosis Date  . Arthritis    OSTEOARTHRITIS,PSORIATIC ARTHRITIS  . Breast cancer ([place]) 09/2014   LT LUMPECTOMY  . History of chickenpox   . History of diverticulitis   . Osteoporosis   . Personal history of radiation therapy 2016   BREAST CA  . Psoriasis     PAST SURGICAL HISTORY :   Past Surgical History:  Procedure Laterality Date  . BREAST BIOPSY Left 09/28/14   positive  . BREAST LUMPECTOMY Left 2016   INVASIVE LOBULAR CARCINOMA OF BREAST, NOTTINGHAM GRADE 2  . COLONOSCOPY    . COLONOSCOPY WITH PROPOFOL N/A 03/30/2018   Procedure: COLONOSCOPY WITH PROPOFOL;  Surgeon: Toledo, [name], MD;  Location: [facility] ENDOSCOPY;  Service: Gastroenterology;  Laterality: N/A;  . FRACTURE SURGERY Left    WRIST FRACTURE REPAIR  . MASTECTOMY Left    PARTIAL WITH LYMPH NODE BIOPSY  . TONSILLECTOMY     WITH ADENOIDECTOMY    FAMILY HISTORY :   Family History  Problem Relation Age  of Onset  . Diabetes Mother   . Lung cancer Father   . Diabetes Sister   . Cancer Brother        went to brain but no one knew the primary  . Breast cancer Neg Hx     SOCIAL HISTORY:   Social History   Tobacco Use  . Smoking status: Never Smoker  . Smokeless tobacco: Never Used  Vaping Use  . Vaping Use: Never used  Substance Use Topics  . Alcohol use: No  . Drug use: No    ALLERGIES:   has No Known Allergies.  MEDICATIONS:  Current Outpatient Medications  Medication Sig Dispense Refill  . Adalimumab (HUMIRA) 40 MG/0.8ML PSKT Inject into the skin every 14 (fourteen) days.    Marland Kitchen alendronate (FOSAMAX) 70 MG tablet Take 70 mg by mouth once a week. Take with a full glass of water on an empty stomach.    Marland Kitchen anastrozole (ARIMIDEX) 1 MG tablet Take 1 tablet (1 mg total) by mouth daily. 90 tablet 3  . Calcium Carbonate-Vitamin D 600-400 MG-UNIT tablet Take by mouth.    . fluticasone (FLONASE) 50 MCG/ACT nasal spray as needed.      No current facility-administered medications for this visit.    PHYSICAL EXAMINATION: ECOG PERFORMANCE STATUS: 0 - Asymptomatic  BP (!) 142/52 (BP Location: Right Arm, Patient Position: Sitting, Cuff Size: Normal)   Pulse 72   Temp 98.6 F (37 C) (Tympanic)   Wt 137 lb (62.1 kg)   BMI 25.06 kg/m   Filed Weights   01/27/20 1027  Weight: 137 lb (62.1 kg)      Physical Activity:   . Days of Exercise per Week:   . Minutes of Exercise per Session:     Physical Exam HENT:     Head: Normocephalic and atraumatic.     Mouth/Throat:     Pharynx: No oropharyngeal exudate.  Eyes:     Pupils: Pupils are equal, round, and reactive to light.  Cardiovascular:     Rate and Rhythm: Normal rate and regular rhythm.  Pulmonary:     Effort: No respiratory distress.     Breath sounds: No wheezing.  Abdominal:     General: Bowel sounds are normal. There is no distension.     Palpations: Abdomen is soft. There is no mass.     Tenderness: There is no abdominal tenderness. There is no guarding or rebound.  Musculoskeletal:        General: No tenderness. Normal range of motion.     Cervical back: Normal range of motion and neck supple.  Skin:    General: Skin is warm.     Comments: Right and left BREAST exam (in the presence of nurse)- no unusual skin changes or dominant masses felt. Surgical scars noted.   Neurological:     Mental Status: She is  alert and oriented to person, place, and time.  Psychiatric:        Mood and Affect: Affect normal.       LABORATORY DATA:  I have reviewed the data as listed    Component Value Date/Time   NA 140 01/27/2020 1003   NA 139 11/09/2014 1257   K 4.4 01/27/2020 1003   K 4.7 11/09/2014 1257   CL 104 01/27/2020 1003   CL 105 11/09/2014 1257   CO2 26 01/27/2020 1003   CO2 27 11/09/2014 1257   GLUCOSE 170 (H) 01/27/2020 1003   GLUCOSE 110 (H) 11/09/2014 1257  BUN 16 01/27/2020 1003   BUN 15 11/09/2014 1257   CREATININE 1.12 (H) 01/27/2020 1003   CREATININE 0.85 11/09/2014 1257   CALCIUM 9.1 01/27/2020 1003   CALCIUM 9.9 11/09/2014 1257   PROT 7.4 01/27/2020 1003   ALBUMIN 3.7 01/27/2020 1003   AST 26 01/27/2020 1003   ALT 13 01/27/2020 1003   ALKPHOS 60 01/27/2020 1003   BILITOT 0.6 01/27/2020 1003   GFRNONAA 47 (L) 01/27/2020 1003   GFRNONAA >60 11/09/2014 1257   GFRAA 54 (L) 01/27/2020 1003   GFRAA >60 11/09/2014 1257    No results found for: SPEP, UPEP  Lab Results  Component Value Date   WBC 4.8 01/27/2020   NEUTROABS 2.6 01/27/2020   HGB 13.0 01/27/2020   HCT 38.6 01/27/2020   MCV 88.5 01/27/2020   PLT 138 (L) 01/27/2020      Chemistry      Component Value Date/Time   NA 140 01/27/2020 1003   NA 139 11/09/2014 1257   K 4.4 01/27/2020 1003   K 4.7 11/09/2014 1257   CL 104 01/27/2020 1003   CL 105 11/09/2014 1257   CO2 26 01/27/2020 1003   CO2 27 11/09/2014 1257   BUN 16 01/27/2020 1003   BUN 15 11/09/2014 1257   CREATININE 1.12 (H) 01/27/2020 1003   CREATININE 0.85 11/09/2014 1257      Component Value Date/Time   CALCIUM 9.1 01/27/2020 1003   CALCIUM 9.9 11/09/2014 1257   ALKPHOS 60 01/27/2020 1003   AST 26 01/27/2020 1003   ALT 13 01/27/2020 1003   BILITOT 0.6 01/27/2020 1003        ASSESSMENT & PLAN:   Carcinoma of overlapping sites of left breast in female, estrogen receptor positive ([place]) # Stage I lobular cancer LEFT BREAST -  currently on adjuvant arimidex.  Stable.    # Clinically no recurrence of recurrence.  Mammogram June 2021 -WNL; will discontinue Arimidex summer 2021.  #Chronic kidney disease-stage III GFR 47; discussed the importance of avoiding NSAIDs like Aleve Motrin and Advil; okay with Tylenol as needed for arthritis.  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  surveillance.  # Bone density aug 2018- -osteoporosis on Fosamax. STABLE;  Continue calcium+ vit D.  Defer to PCP.   # DISPOSITION: Discussed option of follow-up at the end of 5 years.  Patient agrees to follow-up with PCP # follow up as needed-Dr.B      Cammie Sickle, MD 01/27/2020 1:31 PM

## 2020-02-23 ENCOUNTER — Other Ambulatory Visit: Payer: Self-pay | Admitting: Internal Medicine

## 2020-02-23 DIAGNOSIS — Z170 Estrogen receptor positive status [ER+]: Secondary | ICD-10-CM

## 2020-02-23 NOTE — Telephone Encounter (Signed)
ASSESSMENT & PLAN:   Carcinoma of overlapping sites of left breast in female, estrogen receptor positive ([place]) # Stage I lobular cancer LEFT BREAST - currently on adjuvant arimidex.  Stable.    # Clinically no recurrence of recurrence.  Mammogram June 2021 -WNL; will discontinue Arimidex summer 2021.  #Chronic kidney disease-stage III GFR 47; discussed the importance of avoiding NSAIDs like Aleve Motrin and Advil; okay with Tylenol as needed for arthritis.  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  surveillance.  # Bone density aug 2018- -osteoporosis on Fosamax. STABLE;  Continue calcium+ vit D.  Defer to PCP.   # DISPOSITION: Discussed option of follow-up at the end of 5 years.  Patient agrees to follow-up with PCP # follow up as needed-Dr.B

## 2020-02-23 NOTE — Telephone Encounter (Signed)
Dr. Rogue Bussing - Do you want to discontinue the arimidex.

## 2021-01-24 ENCOUNTER — Other Ambulatory Visit: Payer: Self-pay | Admitting: Internal Medicine

## 2021-01-24 DIAGNOSIS — C50812 Malignant neoplasm of overlapping sites of left female breast: Secondary | ICD-10-CM

## 2021-01-29 ENCOUNTER — Other Ambulatory Visit: Payer: Self-pay | Admitting: Internal Medicine

## 2021-01-29 DIAGNOSIS — Z1231 Encounter for screening mammogram for malignant neoplasm of breast: Secondary | ICD-10-CM

## 2021-02-05 ENCOUNTER — Other Ambulatory Visit: Payer: Self-pay

## 2021-02-05 ENCOUNTER — Ambulatory Visit
Admission: RE | Admit: 2021-02-05 | Discharge: 2021-02-05 | Disposition: A | Discharge status: Home / Self Care | Payer: Medicare HMO | Source: Ambulatory Visit | Attending: Internal Medicine | Admitting: Internal Medicine

## 2021-02-05 DIAGNOSIS — Z1231 Encounter for screening mammogram for malignant neoplasm of breast: Secondary | ICD-10-CM | POA: Diagnosis not present

## 2021-12-27 ENCOUNTER — Other Ambulatory Visit: Payer: Self-pay | Admitting: Internal Medicine

## 2021-12-27 DIAGNOSIS — Z1231 Encounter for screening mammogram for malignant neoplasm of breast: Secondary | ICD-10-CM

## 2022-02-06 ENCOUNTER — Ambulatory Visit
Admission: RE | Admit: 2022-02-06 | Discharge: 2022-02-06 | Disposition: A | Discharge status: Home / Self Care | Payer: Medicare HMO | Source: Ambulatory Visit | Attending: Internal Medicine | Admitting: Internal Medicine

## 2022-02-06 DIAGNOSIS — Z1231 Encounter for screening mammogram for malignant neoplasm of breast: Secondary | ICD-10-CM | POA: Diagnosis present

## 2022-09-26 ENCOUNTER — Other Ambulatory Visit: Payer: Self-pay | Admitting: Internal Medicine

## 2022-09-26 DIAGNOSIS — Z1231 Encounter for screening mammogram for malignant neoplasm of breast: Secondary | ICD-10-CM

## 2022-10-30 IMAGING — MG MM DIGITAL SCREENING BILAT W/ TOMO AND CAD
8 series · 9 of 24 positions shown · non-contrast
Comparison: Previous exam(s).

CLINICAL DATA: Screening.

EXAM:
DIGITAL SCREENING BILATERAL MAMMOGRAM WITH TOMOSYNTHESIS AND CAD
TECHNIQUE: Bilateral screening digital craniocaudal and mediolateral oblique
mammograms were obtained. Bilateral screening digital breast
tomosynthesis was performed. The images were evaluated with
computer-aided detection.

[L CC synth-2D]
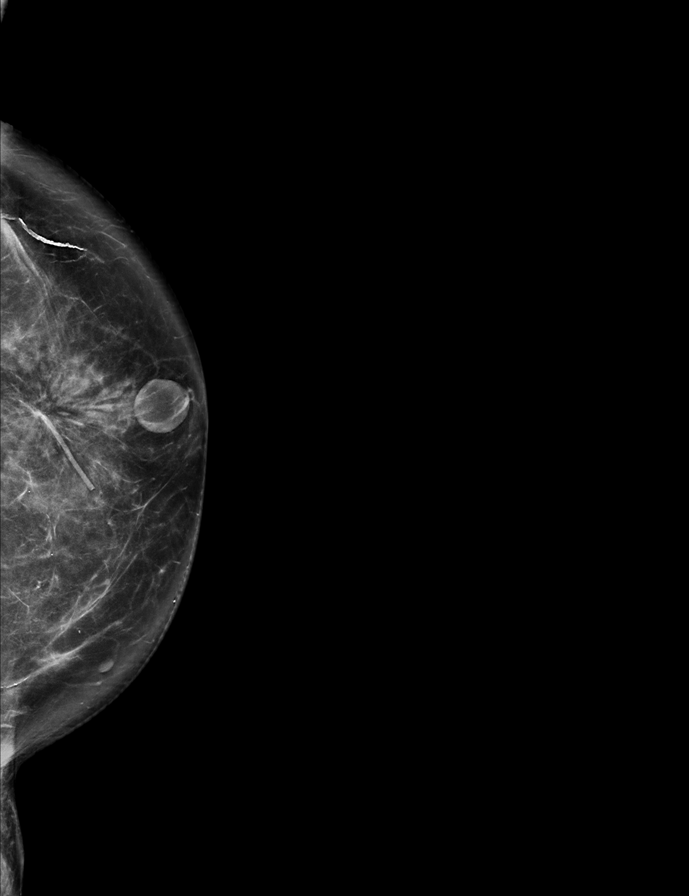

[R MLO synth-2D]
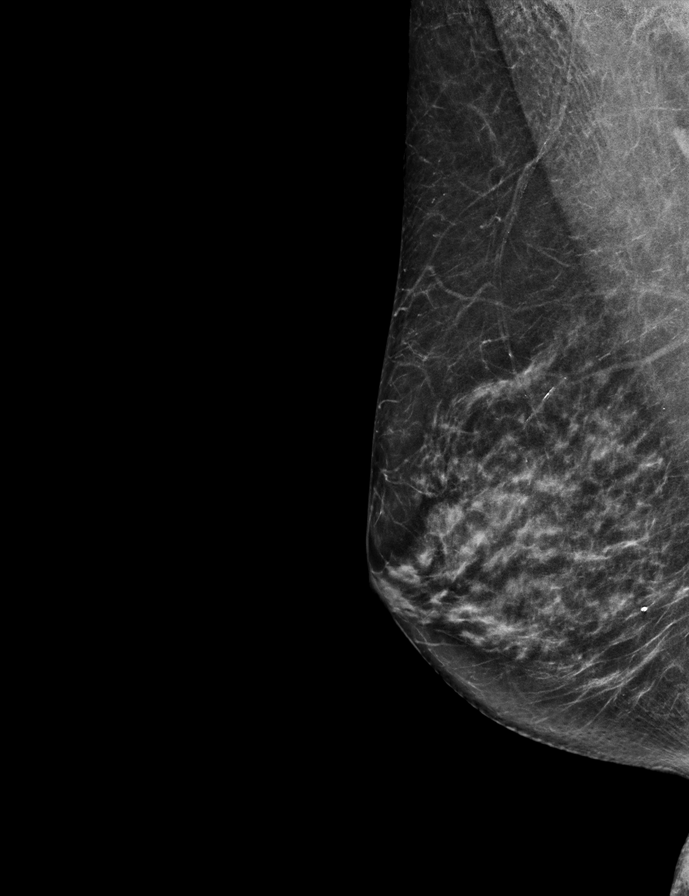

[R CC synth-2D]
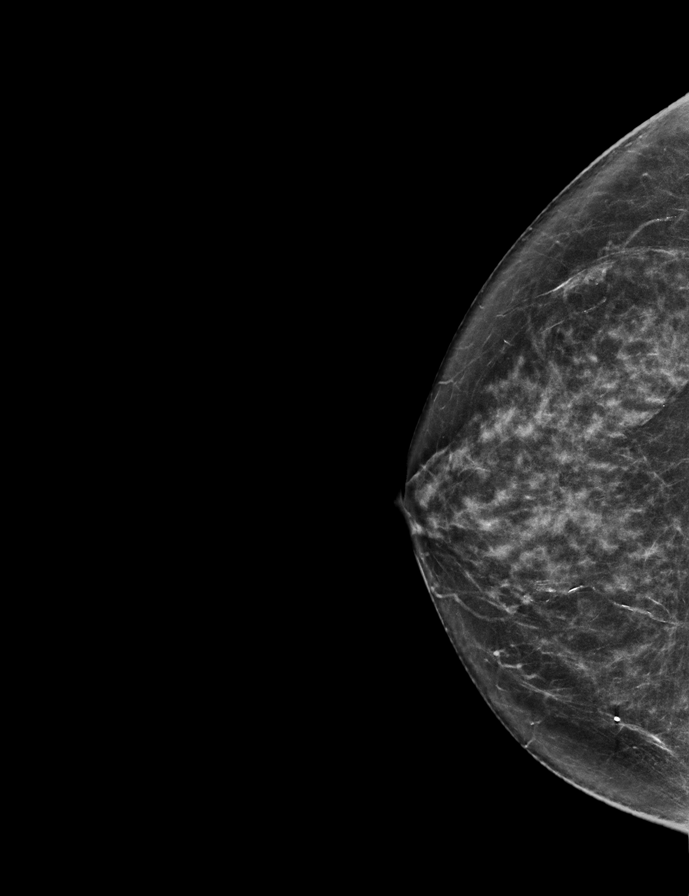

[L MLO synth-2D]
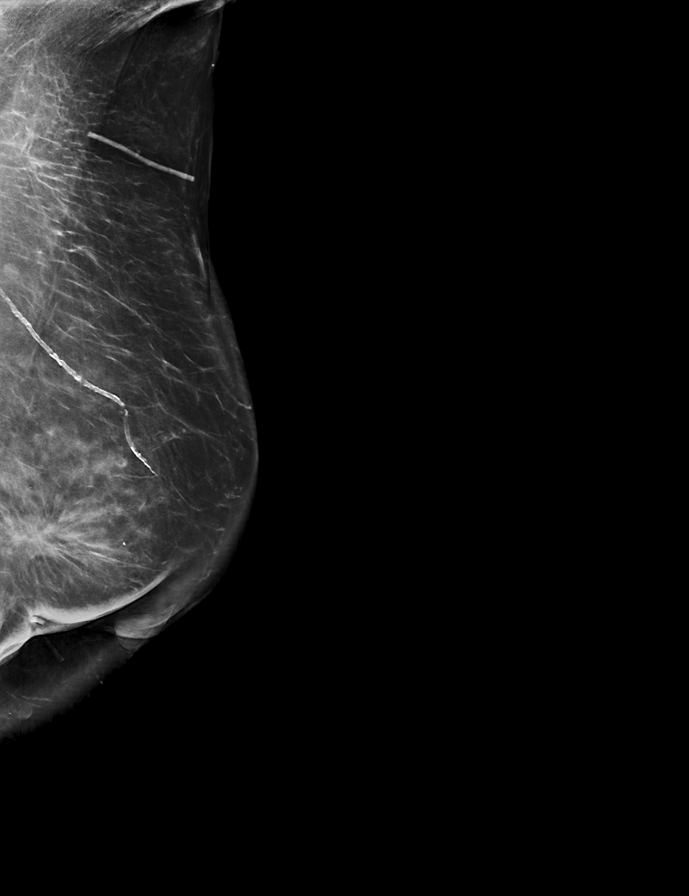

[L MLO tomo · 2 of 93 frames shown]
[frame 30/93]
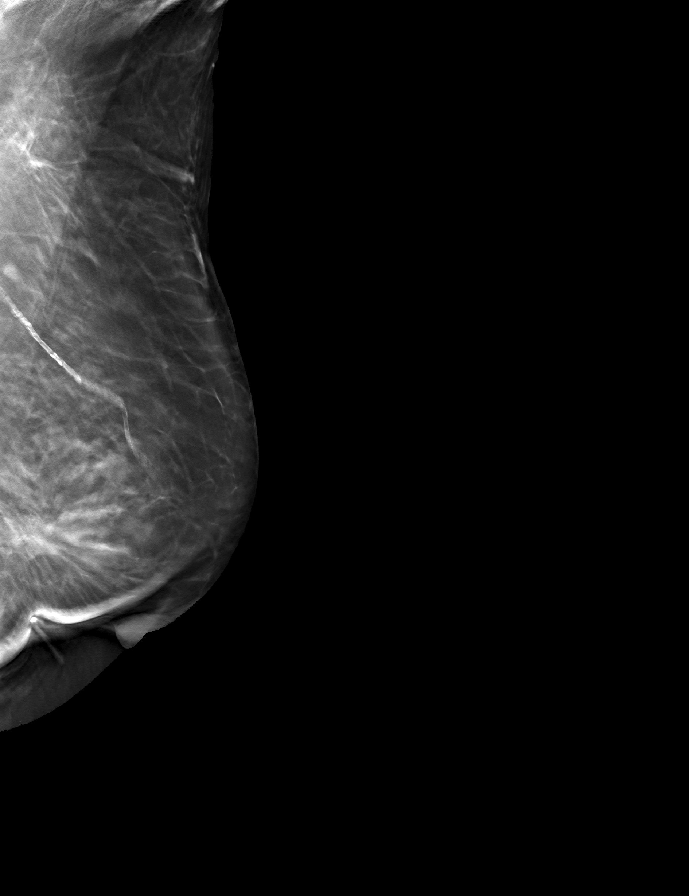
[frame 47/93]
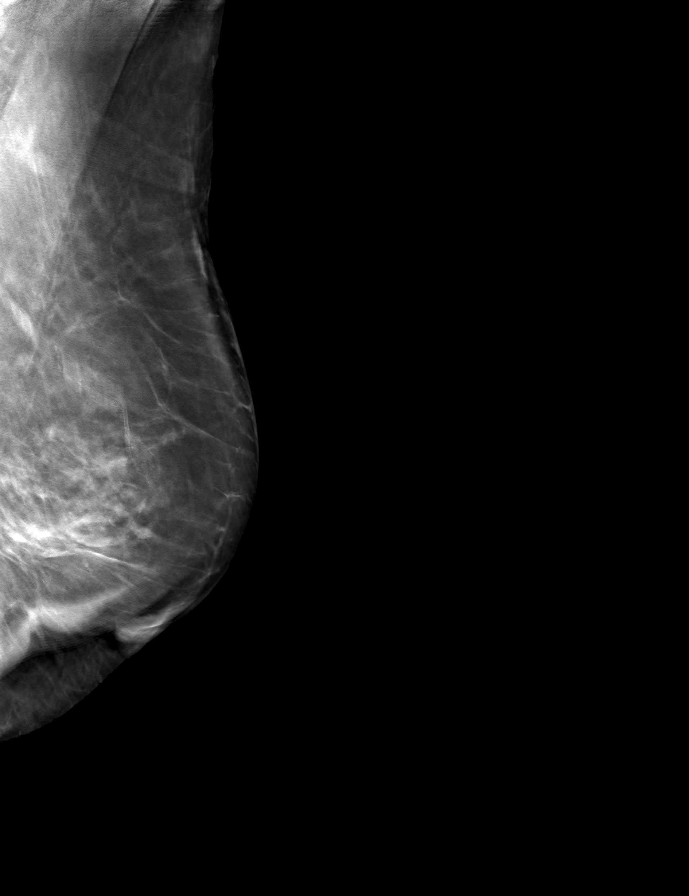

[R MLO tomo · tomo slice 31/60.0]
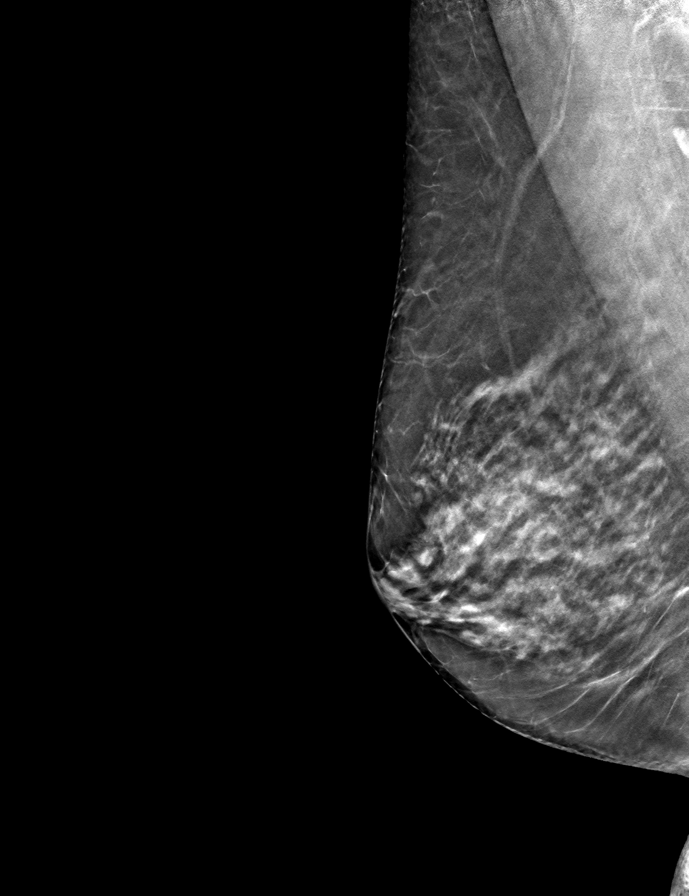

[R CC tomo · tomo slice 32/63.0]
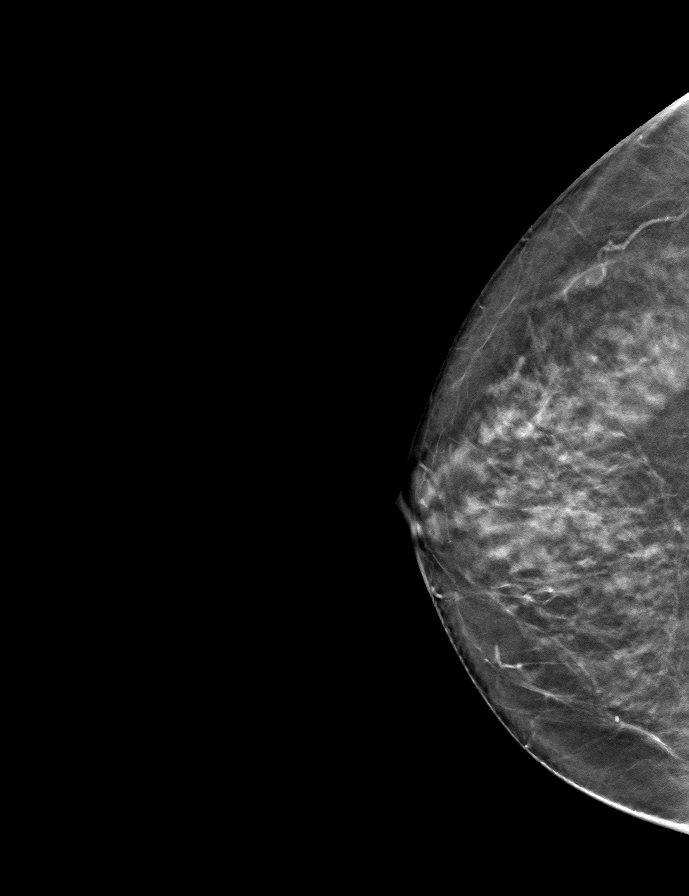

[L CC tomo · tomo slice 36/71.0]
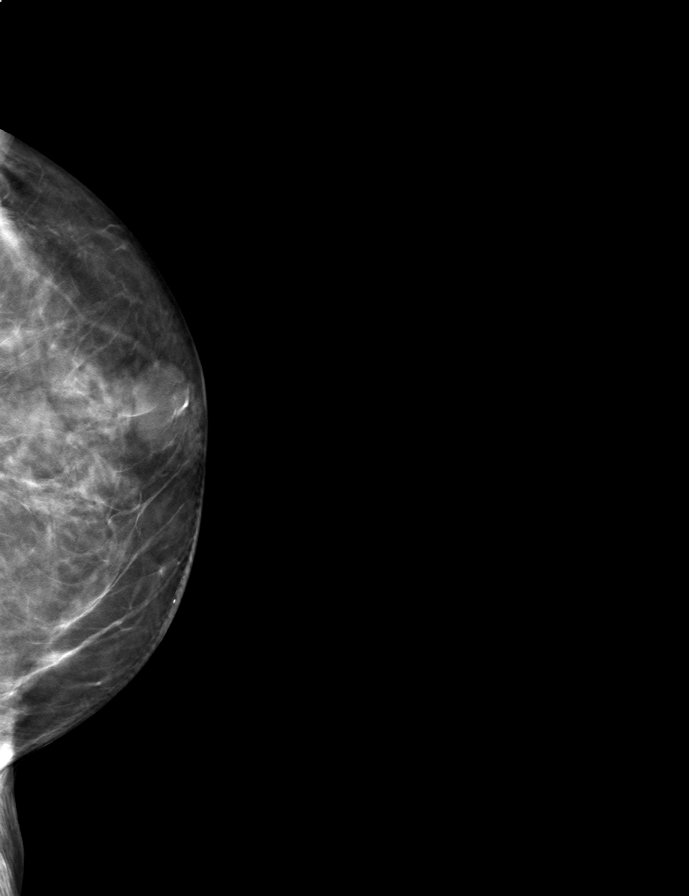

[9 of 24 positions shown; findings below may reference images not displayed]

ACR Breast Density Category c: The breast tissue is heterogeneously
dense, which may obscure small masses.
FINDINGS: There are no findings suspicious for malignancy. There is density
and architectural distortion within the LEFT breast, consistent with
postsurgical changes. These are stable in comparison to prior.
IMPRESSION: No mammographic evidence of malignancy. A result letter of this
screening mammogram will be mailed directly to the patient.

RECOMMENDATION:
Screening mammogram in one year. (Code:TI-X-0R8)

BI-RADS CATEGORY  2: Benign.

## 2023-02-09 ENCOUNTER — Ambulatory Visit
Admission: RE | Admit: 2023-02-09 | Discharge: 2023-02-09 | Disposition: A | Discharge status: Home / Self Care | Payer: Medicare HMO | Source: Ambulatory Visit | Attending: Internal Medicine | Admitting: Internal Medicine

## 2023-02-09 DIAGNOSIS — Z1231 Encounter for screening mammogram for malignant neoplasm of breast: Secondary | ICD-10-CM | POA: Diagnosis present

## 2023-09-29 DIAGNOSIS — L4050 Arthropathic psoriasis, unspecified: Secondary | ICD-10-CM | POA: Diagnosis not present

## 2023-09-29 DIAGNOSIS — R7309 Other abnormal glucose: Secondary | ICD-10-CM | POA: Diagnosis not present

## 2023-09-29 DIAGNOSIS — L409 Psoriasis, unspecified: Secondary | ICD-10-CM | POA: Diagnosis not present

## 2023-09-29 DIAGNOSIS — C50812 Malignant neoplasm of overlapping sites of left female breast: Secondary | ICD-10-CM | POA: Diagnosis not present

## 2023-09-29 DIAGNOSIS — R7989 Other specified abnormal findings of blood chemistry: Secondary | ICD-10-CM | POA: Diagnosis not present

## 2023-09-29 DIAGNOSIS — Z170 Estrogen receptor positive status [ER+]: Secondary | ICD-10-CM | POA: Diagnosis not present

## 2023-09-29 DIAGNOSIS — Z0000 Encounter for general adult medical examination without abnormal findings: Secondary | ICD-10-CM | POA: Diagnosis not present

## 2023-10-06 ENCOUNTER — Other Ambulatory Visit: Payer: Self-pay | Admitting: Internal Medicine

## 2023-10-06 DIAGNOSIS — E559 Vitamin D deficiency, unspecified: Secondary | ICD-10-CM | POA: Diagnosis not present

## 2023-10-06 DIAGNOSIS — L409 Psoriasis, unspecified: Secondary | ICD-10-CM | POA: Diagnosis not present

## 2023-10-06 DIAGNOSIS — L4050 Arthropathic psoriasis, unspecified: Secondary | ICD-10-CM | POA: Diagnosis not present

## 2023-10-06 DIAGNOSIS — Z0000 Encounter for general adult medical examination without abnormal findings: Secondary | ICD-10-CM | POA: Diagnosis not present

## 2023-10-06 DIAGNOSIS — Z1231 Encounter for screening mammogram for malignant neoplasm of breast: Secondary | ICD-10-CM | POA: Diagnosis not present

## 2023-10-06 DIAGNOSIS — C50812 Malignant neoplasm of overlapping sites of left female breast: Secondary | ICD-10-CM | POA: Diagnosis not present

## 2023-10-06 DIAGNOSIS — Z170 Estrogen receptor positive status [ER+]: Secondary | ICD-10-CM | POA: Diagnosis not present

## 2023-10-06 DIAGNOSIS — R7309 Other abnormal glucose: Secondary | ICD-10-CM | POA: Diagnosis not present

## 2023-10-13 DIAGNOSIS — D2271 Melanocytic nevi of right lower limb, including hip: Secondary | ICD-10-CM | POA: Diagnosis not present

## 2023-10-13 DIAGNOSIS — L400 Psoriasis vulgaris: Secondary | ICD-10-CM | POA: Diagnosis not present

## 2023-10-13 DIAGNOSIS — D2261 Melanocytic nevi of right upper limb, including shoulder: Secondary | ICD-10-CM | POA: Diagnosis not present

## 2023-10-13 DIAGNOSIS — L570 Actinic keratosis: Secondary | ICD-10-CM | POA: Diagnosis not present

## 2023-10-13 DIAGNOSIS — D2272 Melanocytic nevi of left lower limb, including hip: Secondary | ICD-10-CM | POA: Diagnosis not present

## 2023-10-13 DIAGNOSIS — D2262 Melanocytic nevi of left upper limb, including shoulder: Secondary | ICD-10-CM | POA: Diagnosis not present

## 2023-10-14 DIAGNOSIS — Z79899 Other long term (current) drug therapy: Secondary | ICD-10-CM | POA: Diagnosis not present

## 2023-10-14 DIAGNOSIS — L4050 Arthropathic psoriasis, unspecified: Secondary | ICD-10-CM | POA: Diagnosis not present

## 2023-10-14 DIAGNOSIS — M1711 Unilateral primary osteoarthritis, right knee: Secondary | ICD-10-CM | POA: Diagnosis not present

## 2023-12-15 DIAGNOSIS — H5213 Myopia, bilateral: Secondary | ICD-10-CM | POA: Diagnosis not present

## 2023-12-15 DIAGNOSIS — H26493 Other secondary cataract, bilateral: Secondary | ICD-10-CM | POA: Diagnosis not present

## 2024-02-11 DIAGNOSIS — L4050 Arthropathic psoriasis, unspecified: Secondary | ICD-10-CM | POA: Diagnosis not present

## 2024-02-11 DIAGNOSIS — Z79899 Other long term (current) drug therapy: Secondary | ICD-10-CM | POA: Diagnosis not present

## 2024-02-11 DIAGNOSIS — L400 Psoriasis vulgaris: Secondary | ICD-10-CM | POA: Diagnosis not present

## 2024-02-12 ENCOUNTER — Ambulatory Visit
Admission: RE | Admit: 2024-02-12 | Discharge: 2024-02-12 | Disposition: A | Discharge status: Home / Self Care | Source: Ambulatory Visit | Attending: Internal Medicine | Admitting: Internal Medicine

## 2024-02-12 DIAGNOSIS — Z1231 Encounter for screening mammogram for malignant neoplasm of breast: Secondary | ICD-10-CM | POA: Diagnosis not present

## 2024-04-01 DIAGNOSIS — R8290 Unspecified abnormal findings in urine: Secondary | ICD-10-CM | POA: Diagnosis not present

## 2024-04-01 DIAGNOSIS — L4050 Arthropathic psoriasis, unspecified: Secondary | ICD-10-CM | POA: Diagnosis not present

## 2024-04-01 DIAGNOSIS — R7309 Other abnormal glucose: Secondary | ICD-10-CM | POA: Diagnosis not present

## 2024-04-01 DIAGNOSIS — E559 Vitamin D deficiency, unspecified: Secondary | ICD-10-CM | POA: Diagnosis not present

## 2024-04-01 DIAGNOSIS — Z170 Estrogen receptor positive status [ER+]: Secondary | ICD-10-CM | POA: Diagnosis not present

## 2024-04-01 DIAGNOSIS — Z1231 Encounter for screening mammogram for malignant neoplasm of breast: Secondary | ICD-10-CM | POA: Diagnosis not present

## 2024-04-01 DIAGNOSIS — C50812 Malignant neoplasm of overlapping sites of left female breast: Secondary | ICD-10-CM | POA: Diagnosis not present

## 2024-04-01 DIAGNOSIS — L409 Psoriasis, unspecified: Secondary | ICD-10-CM | POA: Diagnosis not present

## 2024-04-08 DIAGNOSIS — Z170 Estrogen receptor positive status [ER+]: Secondary | ICD-10-CM | POA: Diagnosis not present

## 2024-04-08 DIAGNOSIS — Z1331 Encounter for screening for depression: Secondary | ICD-10-CM | POA: Diagnosis not present

## 2024-04-08 DIAGNOSIS — L409 Psoriasis, unspecified: Secondary | ICD-10-CM | POA: Diagnosis not present

## 2024-04-08 DIAGNOSIS — Z0000 Encounter for general adult medical examination without abnormal findings: Secondary | ICD-10-CM | POA: Diagnosis not present

## 2024-04-08 DIAGNOSIS — R7989 Other specified abnormal findings of blood chemistry: Secondary | ICD-10-CM | POA: Diagnosis not present

## 2024-04-08 DIAGNOSIS — C50812 Malignant neoplasm of overlapping sites of left female breast: Secondary | ICD-10-CM | POA: Diagnosis not present

## 2024-04-08 DIAGNOSIS — L4050 Arthropathic psoriasis, unspecified: Secondary | ICD-10-CM | POA: Diagnosis not present

## 2024-04-08 DIAGNOSIS — R7309 Other abnormal glucose: Secondary | ICD-10-CM | POA: Diagnosis not present

## 2024-06-09 DIAGNOSIS — L400 Psoriasis vulgaris: Secondary | ICD-10-CM | POA: Diagnosis not present

## 2024-06-09 DIAGNOSIS — Z79899 Other long term (current) drug therapy: Secondary | ICD-10-CM | POA: Diagnosis not present

## 2024-06-09 DIAGNOSIS — L4050 Arthropathic psoriasis, unspecified: Secondary | ICD-10-CM | POA: Diagnosis not present

## 2024-06-09 DIAGNOSIS — G8929 Other chronic pain: Secondary | ICD-10-CM | POA: Diagnosis not present

## 2024-06-09 DIAGNOSIS — M545 Low back pain, unspecified: Secondary | ICD-10-CM | POA: Diagnosis not present

## 2024-06-14 DIAGNOSIS — D2271 Melanocytic nevi of right lower limb, including hip: Secondary | ICD-10-CM | POA: Diagnosis not present

## 2024-06-14 DIAGNOSIS — D2262 Melanocytic nevi of left upper limb, including shoulder: Secondary | ICD-10-CM | POA: Diagnosis not present

## 2024-06-14 DIAGNOSIS — L570 Actinic keratosis: Secondary | ICD-10-CM | POA: Diagnosis not present

## 2024-06-14 DIAGNOSIS — D2261 Melanocytic nevi of right upper limb, including shoulder: Secondary | ICD-10-CM | POA: Diagnosis not present

## 2024-06-14 DIAGNOSIS — L400 Psoriasis vulgaris: Secondary | ICD-10-CM | POA: Diagnosis not present

## 2024-06-14 DIAGNOSIS — D2272 Melanocytic nevi of left lower limb, including hip: Secondary | ICD-10-CM | POA: Diagnosis not present

## 2024-06-15 DIAGNOSIS — M5459 Other low back pain: Secondary | ICD-10-CM | POA: Diagnosis not present

## 2024-06-29 DIAGNOSIS — M5441 Lumbago with sciatica, right side: Secondary | ICD-10-CM | POA: Diagnosis not present
